# Patient Record
Sex: Male | Born: 1955 | Race: White | Hispanic: No | Marital: Married | Smoking: Never smoker
Health system: Southern US, Community
[De-identification: ages and names within clinical notes are randomized; demographics above are authoritative.]

## PROBLEM LIST (undated history)

## (undated) DIAGNOSIS — E785 Hyperlipidemia, unspecified: Secondary | ICD-10-CM

## (undated) DIAGNOSIS — F5104 Psychophysiologic insomnia: Secondary | ICD-10-CM

## (undated) DIAGNOSIS — M5430 Sciatica, unspecified side: Secondary | ICD-10-CM

## (undated) DIAGNOSIS — I10 Essential (primary) hypertension: Secondary | ICD-10-CM

## (undated) DIAGNOSIS — M1990 Unspecified osteoarthritis, unspecified site: Secondary | ICD-10-CM

## (undated) HISTORY — DX: Sciatica, unspecified side: M54.30

## (undated) HISTORY — PX: OTHER SURGICAL HISTORY: SHX169

## (undated) HISTORY — DX: Hyperlipidemia, unspecified: E78.5

## (undated) HISTORY — DX: Essential (primary) hypertension: I10

## (undated) HISTORY — PX: CHOLECYSTECTOMY: SHX55

## (undated) HISTORY — PX: HERNIA REPAIR: SHX51

---

## 2000-09-28 HISTORY — PX: OTHER SURGICAL HISTORY: SHX169

## 2005-10-20 ENCOUNTER — Ambulatory Visit: Payer: Self-pay | Admitting: Family Medicine

## 2005-11-13 ENCOUNTER — Ambulatory Visit: Payer: Self-pay | Admitting: Family Medicine

## 2005-11-13 LAB — CONVERTED CEMR LAB
Blood Glucose, Fasting: 95 mg/dL
TSH: 2.08 microintl units/mL
WBC, blood: 3.9 10*3/uL

## 2005-11-27 ENCOUNTER — Ambulatory Visit: Payer: Self-pay | Admitting: Family Medicine

## 2006-07-06 ENCOUNTER — Ambulatory Visit: Payer: Self-pay | Admitting: Family Medicine

## 2006-10-08 ENCOUNTER — Ambulatory Visit: Payer: Self-pay | Admitting: Family Medicine

## 2007-01-06 ENCOUNTER — Ambulatory Visit: Payer: Self-pay | Admitting: Family Medicine

## 2007-01-06 LAB — CONVERTED CEMR LAB
Albumin: 3.9 g/dL (ref 3.5–5.2)
Alkaline Phosphatase: 50 units/L (ref 39–117)
BUN: 14 mg/dL (ref 6–23)
GFR calc Af Amer: 91 mL/min
GFR calc non Af Amer: 75 mL/min
LDL Cholesterol: 127 mg/dL — ABNORMAL HIGH (ref 0–99)
PSA: 0.99 ng/mL (ref 0.10–4.00)
Potassium: 4.8 meq/L (ref 3.5–5.1)
TSH: 2.59 microintl units/mL
TSH: 2.59 microintl units/mL (ref 0.35–5.50)
Total CHOL/HDL Ratio: 4.1
Triglycerides: 69 mg/dL (ref 0–149)
VLDL: 14 mg/dL (ref 0–40)

## 2007-01-14 ENCOUNTER — Ambulatory Visit: Payer: Self-pay | Admitting: Family Medicine

## 2007-03-14 ENCOUNTER — Ambulatory Visit: Payer: Self-pay | Admitting: Gastroenterology

## 2007-03-25 ENCOUNTER — Encounter: Payer: Self-pay | Admitting: Family Medicine

## 2007-03-25 ENCOUNTER — Ambulatory Visit: Payer: Self-pay | Admitting: Gastroenterology

## 2007-03-25 HISTORY — PX: COLONOSCOPY: SHX174

## 2007-03-25 LAB — HM COLONOSCOPY

## 2007-10-30 ENCOUNTER — Ambulatory Visit: Payer: Self-pay | Admitting: General Surgery

## 2007-10-30 HISTORY — PX: OTHER SURGICAL HISTORY: SHX169

## 2007-11-11 ENCOUNTER — Encounter: Payer: Self-pay | Admitting: Family Medicine

## 2008-06-22 ENCOUNTER — Ambulatory Visit: Payer: Self-pay | Admitting: Family Medicine

## 2008-06-25 LAB — CONVERTED CEMR LAB
ALT: 34 units/L (ref 0–53)
Basophils Absolute: 0 10*3/uL (ref 0.0–0.1)
Bilirubin, Direct: 0.1 mg/dL (ref 0.0–0.3)
CO2: 30 meq/L (ref 19–32)
Calcium: 9.3 mg/dL (ref 8.4–10.5)
Chloride: 109 meq/L (ref 96–112)
Cholesterol: 230 mg/dL (ref 0–200)
Creatinine, Ser: 1.2 mg/dL (ref 0.4–1.5)
Direct LDL: 173.3 mg/dL
Eosinophils Absolute: 0.1 10*3/uL (ref 0.0–0.7)
GFR calc non Af Amer: 68 mL/min
HDL: 43.9 mg/dL (ref >39.0)
Lymphocytes Relative: 26 % (ref 12.0–46.0)
MCHC: 35.5 g/dL (ref 30.0–36.0)
MCV: 90.6 fL (ref 78.0–100.0)
Neutro Abs: 3.1 10*3/uL (ref 1.4–7.7)
Neutrophils Relative %: 60.4 % (ref 43.0–77.0)
PSA: 1.48 ng/mL (ref 0.10–4.00)
RDW: 12 % (ref 11.5–14.6)
Sodium: 142 meq/L (ref 135–145)
TSH: 3.15 microintl units/mL (ref 0.35–5.50)
Total Bilirubin: 0.8 mg/dL (ref 0.3–1.2)
VLDL: 25 mg/dL (ref 0–40)

## 2008-06-27 ENCOUNTER — Encounter: Payer: Self-pay | Admitting: Family Medicine

## 2008-06-27 ENCOUNTER — Ambulatory Visit: Payer: Self-pay | Admitting: Family Medicine

## 2008-06-27 DIAGNOSIS — I10 Essential (primary) hypertension: Secondary | ICD-10-CM | POA: Insufficient documentation

## 2008-08-03 ENCOUNTER — Ambulatory Visit: Payer: Self-pay | Admitting: Family Medicine

## 2008-08-06 LAB — CONVERTED CEMR LAB: ALT: 41 units/L (ref 0–53)

## 2008-09-10 ENCOUNTER — Ambulatory Visit: Payer: Self-pay | Admitting: Family Medicine

## 2008-09-10 LAB — CONVERTED CEMR LAB
Cholesterol: 118 mg/dL (ref 0–200)
HDL: 47.9 mg/dL (ref >39.0)
LDL Cholesterol: 60 mg/dL (ref 0–99)
Triglycerides: 50 mg/dL (ref 0–149)
VLDL: 10 mg/dL (ref 0–40)

## 2008-09-17 ENCOUNTER — Ambulatory Visit: Payer: Self-pay | Admitting: Family Medicine

## 2008-10-15 ENCOUNTER — Telehealth: Payer: Self-pay | Admitting: Family Medicine

## 2008-11-19 ENCOUNTER — Telehealth: Payer: Self-pay | Admitting: Family Medicine

## 2008-11-22 ENCOUNTER — Ambulatory Visit: Payer: Self-pay | Admitting: Family Medicine

## 2009-07-10 ENCOUNTER — Telehealth: Payer: Self-pay | Admitting: Family Medicine

## 2009-09-05 ENCOUNTER — Ambulatory Visit: Payer: Self-pay | Admitting: Family Medicine

## 2009-09-05 LAB — CONVERTED CEMR LAB
ALT: 41 units/L (ref 0–53)
Albumin: 4.4 g/dL (ref 3.5–5.2)
BUN: 16 mg/dL (ref 6–23)
Bilirubin, Direct: 0.1 mg/dL (ref 0.0–0.3)
CO2: 29 meq/L (ref 19–32)
Calcium: 9.5 mg/dL (ref 8.4–10.5)
Chloride: 102 meq/L (ref 96–112)
Cholesterol: 130 mg/dL (ref 0–200)
Creatinine, Ser: 1.2 mg/dL (ref 0.4–1.5)
Glucose, Bld: 102 mg/dL — ABNORMAL HIGH (ref 70–99)
LDL Cholesterol: 69 mg/dL (ref 0–99)
PSA: 0.95 ng/mL (ref 0.10–4.00)
TSH: 2.55 microintl units/mL (ref 0.35–5.50)
Total Protein: 7.3 g/dL (ref 6.0–8.3)
VLDL: 20 mg/dL (ref 0.0–40.0)

## 2009-09-11 ENCOUNTER — Ambulatory Visit: Payer: Self-pay | Admitting: Family Medicine

## 2009-10-07 ENCOUNTER — Telehealth: Payer: Self-pay | Admitting: Family Medicine

## 2010-05-01 ENCOUNTER — Encounter (INDEPENDENT_AMBULATORY_CARE_PROVIDER_SITE_OTHER): Payer: Self-pay | Admitting: *Deleted

## 2010-07-03 ENCOUNTER — Ambulatory Visit: Payer: Self-pay | Admitting: Family Medicine

## 2010-07-03 DIAGNOSIS — M5430 Sciatica, unspecified side: Secondary | ICD-10-CM

## 2010-09-11 ENCOUNTER — Ambulatory Visit: Payer: Self-pay | Admitting: Family Medicine

## 2010-09-11 DIAGNOSIS — Z87898 Personal history of other specified conditions: Secondary | ICD-10-CM

## 2010-09-11 LAB — CONVERTED CEMR LAB
ALT: 37 units/L (ref 0–53)
AST: 25 units/L (ref 0–37)
Albumin: 4.2 g/dL (ref 3.5–5.2)
Alkaline Phosphatase: 52 units/L (ref 39–117)
BUN: 15 mg/dL (ref 6–23)
Chloride: 107 meq/L (ref 96–112)
Cholesterol: 143 mg/dL (ref 0–200)
Glucose, Bld: 95 mg/dL (ref 70–99)
HDL: 45.7 mg/dL (ref >39.00)
LDL Cholesterol: 83 mg/dL (ref 0–99)
PSA: 1.34 ng/mL (ref 0.10–4.00)
Potassium: 4.6 meq/L (ref 3.5–5.1)
TSH: 3.37 microintl units/mL (ref 0.35–5.50)
Total Bilirubin: 0.6 mg/dL (ref 0.3–1.2)
Total CHOL/HDL Ratio: 3

## 2010-09-15 ENCOUNTER — Ambulatory Visit: Payer: Self-pay | Admitting: Family Medicine

## 2010-09-15 DIAGNOSIS — I10 Essential (primary) hypertension: Secondary | ICD-10-CM | POA: Insufficient documentation

## 2010-09-15 DIAGNOSIS — E785 Hyperlipidemia, unspecified: Secondary | ICD-10-CM

## 2010-09-23 ENCOUNTER — Telehealth: Payer: Self-pay | Admitting: Family Medicine

## 2010-09-25 ENCOUNTER — Telehealth: Payer: Self-pay | Admitting: Family Medicine

## 2010-09-30 ENCOUNTER — Encounter: Payer: Self-pay | Admitting: Family Medicine

## 2010-10-28 NOTE — Assessment & Plan Note (Signed)
Summary: BACK PAIN/CLE   Vital Signs:  Patient profile:   55 year old male Height:      69 inches Weight:      181.0 pounds BMI:     26.83 Temp:     98.0 degrees F oral Pulse rate:   76 / minute Pulse rhythm:   regular BP sitting:   110 / 72  (left arm) Cuff size:   large  Vitals Entered By: Benny Lennert CMA Duncan Dull) (July 03, 2010 4:03 PM)  History of Present Illness: Chief complaint back pain  55 year old male:  travelling o [place] and pain on the back posterior pain, when sitting for a while, down all the way to ankle.   describes classic right-sided acute sciatica.  Pain originating in the buttock and going down the posterior aspect of the leg 2 and around the ankle.  At this point, no significant back pain.  No numbness and tingling. No bowel or bladder incontinence. No acute injury other than long-standing flight. This is been ongoing for about 2-3 weeks. The patient has tried no interventions, no  stretching, no  range of motion, no heat, no  medications.   His son is a physical therapist in [place].    acute sciatica  Back Pain History:      The patient's back pain has been present for < 6 weeks.  The pain is located in the lower back region and does radiate below the knees.  He states that he has had a prior history of back pain.  The patient has not had any recent physical therapy for his back pain.    Critical Exclusionary Diagnosis Criteria (CEDC) for Back Pain:      The patient denies a history of previous trauma.  He has no prior history of spinal surgery.  There are no symptoms to suggest infection, cauda equina, or psychosocial factors for back pain.  Cancer risk factors include age >50 yrs with new back pain.    Allergies (verified): No Known Drug Allergies  Past History:  Past medical, surgical, family and social histories (including risk factors) reviewed, and no changes noted (except as noted below).  Past Surgical History: Reviewed history  from 06/27/2008 and no changes required. RIH 1990s Hyperthyroid surgery 2002 Laparoscopic Choleycystectomy (Dr Lemar Livings) 10/30/2007 COLONOSCOPY-- INTERNAL HEMORRHOIDS (DR. KAPLAN) :(03/25/2007)  Family History: Reviewed history from 09/11/2009 and no changes required. Father A 73 HTN BAD BACK Mother A 70 Hypothyr  Brother A 47 Htn  CV: + MGF DECEASED FROM MASSIVE MI HBP: + FATHER //+ BROTHER 2 SONS : + MGF// + SELF DM: + MGF GOUT ARTHRITIS: NEGATIVE PROSTATE CANCER: BREAST/OVARIAN./UTERINE CANCER: COLON CANCER: + M AUNT DEPRESSION: NEGATIVE ETOH/DRUG ABUSE: NEGATIVE OTHER NEGATIVE STROKE  Social History: Reviewed history from 06/27/2008 and no changes required. Occupation: Press photographer for [employer] Married   2 Children  Review of Systems       REVIEW OF SYSTEMS  GEN: No systemic complaints, no fevers, chills, sweats, or other acute illnesses MSK: Detailed in the HPI GI: tolerating PO intake without difficulty Neuro: No numbness, parasthesias, or tingling associated. Otherwise the pertinent positives of the ROS are noted above.    Physical Exam  General:  GEN: Well-developed,well-nourished,in no acute distress; alert,appropriate and cooperative throughout examination HEENT: Normocephalic and atraumatic without obvious abnormalities. No apparent alopecia or balding. Ears, externally no deformities PULM: Breathing comfortably in no respiratory distress EXT: No clubbing, cyanosis, or edema PSYCH: Normally interactive. Cooperative during  the interview. Pleasant. Friendly and conversant. Not anxious or depressed appearing. Normal, full affect.  Msk:  Normal Greater trochanteric bursae Full hip ROM Negative Faber Negative Reverse Faber Sciatic Notches: nt piriformis nt Sensation to Gross touch WNL Sensation to pinpricnk WNL DTR 2+ knee and ankle no clonus DP and PT pulses are normal B   Low Back Pain Physical Exam:    Motor Exam/Strength:         Left Ankle  Dorsiflexion (L5,L4):     normal       Left Great Toe Dorsiflexion (L5,L4):     normal       Left Heel Walk (L5,some L4):     normal       Left Single Squat & Rise-Quads (L4):   normal       Left Toe Walk-calf (S1):       normal       Right Ankle Dorsiflexion (L5,L4):     normal       Right Great Toe Dorsiflexion (L5,L4):       normal       Right Heel Walk (L5,some L4):     normal       Right Single Squat & Rise Quads (L4):   normal       Right Toe Walk-calf (S1):       normal    Sensory Exam/Pinprick:        Left Medial Foot (L4):   normal       Left Dorsal Foot (L5):   normal       Left Lateral Foot (S1):   normal       Right Medial Foot (L4):   normal       Right Dorsal Foot (L5):   normal       Right Lateral Foot (S1):   normal    Reflexes:        Left Knee Jerk (L4):     normal       Left Ankle Reflex (S1):   normal       Right Knee Jerk:     normal       Right Ankle Reflex (S1):   normal    Straight Leg Raise (SLR):       Left Straight Leg Raise (SLR):   positive at 40 degrees       Right Straight Leg Raise (SLR):   negative   Impression & Recommendations:  Problem # 1:  SCIATICA, ACUTE (ICD-724.3) Assessment New Exam and history consistent with sciatica Anatomy reviewed using [facility] text  Start ROM protocols, direct massage of piriformis, HIP ROM, core stabily, pelvic stability neurontin for neuropathic irritation  prefers minimalist approach with regards to medication  if not getting better in a couple of weeks, i would probable pulse with steroids, use some zanaflex as well  Complete Medication List: 1)  Lisinopril 5 Mg Tabs (Lisinopril) .Marland Kitchen.. 1  tablet daily by mouth daily 2)  Simvastatin 80 Mg Tabs (Simvastatin) .... Take 1 tablet by mouth at bedtime 3)  Gabapentin 300 Mg Caps (Gabapentin) .Marland Kitchen.. 1 by mouth three times a day Prescriptions: GABAPENTIN 300 MG CAPS (GABAPENTIN) 1 by mouth three times a day  #90 x 3   Entered and Authorized by:   Hannah Beat MD   Signed by:   Hannah Beat MD on 07/03/2010   Method used:   [facility] [phone][facility]retail[facility]  [address]       [place], [place]  [address]       Ph: [phone] or [phone]       Fax: [phone]   RxID:   [id number]   Current Allergies (reviewed today): No known allergies

## 2010-10-28 NOTE — Letter (Signed)
Summary: Nadara Eaton letter  [place] [facility]  [address] [place], [place] [address]   Phone: [phone]  Fax: [phone]       05/01/2010 MRN: [id number]  [facility] [address] [place], [place]  [address]  Dear Mr. DETRICK, [facility] [place], and [facility] announce the retirement of Arta Silence, M.D., from full-time practice at the [facility] office effective March 27, 2010 and his plans of returning part-time.  It is important to Dr. Hetty Ely and to our practice that you understand that [facility] has seven physicians in our office for your health care needs.  We will continue to offer the same exceptional care that you have today.    Dr. Hetty Ely has spoken to many of you about his plans for retirement and returning part-time in the fall.   We will continue to work with you through the transition to schedule appointments for you in the office and meet the high standards that [facility] is committed to.   Again, it is with great pleasure that we share the news that Dr. Hetty Ely will return to [facility] in October of 2011 with a reduced schedule.    If you have any questions, or would like to request an appointment with one of our physicians, please call us at [phone] and press the option for Scheduling an appointment.  We take pleasure in providing you with excellent patient care and look forward to seeing you at your next office visit.  Our [facility] Physicians are:  Tillman Abide, M.D. Laurita Quint, M.D. Roxy Manns, M.D. Kerby Nora, M.D. Hannah Beat, M.D. Ruthe Mannan, M.D. We proudly welcomed Raechel Ache, M.D. and Eustaquio Boyden, M.D. to the practice in July/August 2011.  Sincerely,  [place]

## 2010-10-28 NOTE — Progress Notes (Signed)
Summary: Simvastatin , Lisinopril  Phone Note Refill Request Message from:  Patient on October 07, 2009 3:35 PM  Refills Requested: Medication #1:  LISINOPRIL 5 MG  TABS 1/2  tablet daily by mouth DAILY   Dosage confirmed as above?Dosage Confirmed  Medication #2:  SIMVASTATIN 80 MG TABS take 1/2 tablet by mouth at bedtime. He needs rx to be for 1 tablet daily instead of 1/2 so that he can just half them and it would cost him less that way.   Initial call taken by: Melody Comas,  October 07, 2009 3:36 PM Caller: Patient Call For: Shaune Leeks MD Summary of Call: Patient says that he needs rx for his   Follow-up for Phone Call        Called patient back and explained to him that the prescriptions have to be written the way they are prescribed and if not that could be considered fraud. Patient stated that he is the one that changed his medications to 1/2 daily of the Simvastatin and Lisinopril. Patient stated that he wants to start back taking a whole pill of each and would like this changed on his med sheet and correct [facility] Patient aware that Dr. Hetty Ely is out of the office. Follow-up by: Sydell Axon LPN,  October 07, 2009 4:21 PM  Additional Follow-up for Phone Call Additional follow up Details #1::        Patient notified that rx's have been sent to the pharmacy per his request. Additional Follow-up by: Sydell Axon LPN,  October 09, 2009 3:31 PM    New/Updated Medications: LISINOPRIL 5 MG  TABS (LISINOPRIL) 1  tablet daily by mouth DAILY SIMVASTATIN 80 MG TABS (SIMVASTATIN) take 1 tablet by mouth at bedtime Prescriptions: SIMVASTATIN 80 MG TABS (SIMVASTATIN) take 1 tablet by mouth at bedtime  #30 x 12   Entered and Authorized by:   Shaune Leeks MD   Signed by:   Shaune Leeks MD on 10/09/2009   Method used:   [facility]        [facility] (retail)       [address]       [place], [place]   [address]       Ph: [phone] or [phone]       Fax: [phone]   RxID:   [id number] LISINOPRIL 5 MG  TABS (LISINOPRIL) 1  tablet daily by mouth DAILY  #30 x 12   Entered and Authorized by:   Shaune Leeks MD   Signed by:   Shaune Leeks MD on 10/09/2009   Method used:   [facility]        [facility] (retail)       [address]       [place], [place]  [address]       Ph: [phone] or [phone]       Fax: [phone]   RxID:   [id number]

## 2010-10-30 NOTE — Assessment & Plan Note (Signed)
Summary: CPX/XFER FROM DR SCHALLER/CLE   Vital Signs:  Patient profile:   55 year old male Height:      69 inches Weight:      193.75 pounds BMI:     28.72 Temp:     98 degrees F oral Pulse rate:   84 / minute Pulse rhythm:   regular BP sitting:   122 / 74  (left arm) Cuff size:   large  Vitals Entered By: Delilah Shan CMA (AAMA) (September 15, 2010 8:30 AM) CC: CPX - Transfer from [facility]   History of Present Illness: CPE- see plan  Back pain.  prev with sciatica and on neurontin.  Pain is similar to before. L leg posterior pain, radiating down L leg.  Better up and walking.  Worse sitting and laying down.  No weakness. Prev with relief with neurontin.    Hypertension:      Using medication without problems or lightheadedness: yes Chest pain with exertion:no Edema:no Short of breath:no Average home ZOX:WRUEAVW to today Other issues:no  Elevated Cholesterol: Using medications without problems:yes Muscle aches: no Other complaints: no  On half doses of medicine, updated list.   Current Medications (verified): 1)  Lisinopril 5 Mg  Tabs (Lisinopril) .Marland Kitchen.. 1  Tablet Daily By Mouth Daily 2)  Simvastatin 80 Mg Tabs (Simvastatin) .... Take 1 Tablet By Mouth At Bedtime 3)  Gabapentin 300 Mg Caps (Gabapentin) .Marland Kitchen.. 1 By Mouth Three Times A Day  Allergies: No Known Drug Allergies  Past History:  Past Medical History: Hyperlipidemia Hypertension h/o sciatica  Past Surgical History: RIH 1990s Hyperparathyroid surgery 2002 Laparoscopic Choleycystectomy (Dr Lemar Livings) 10/30/2007 COLONOSCOPY-- INTERNAL HEMORRHOIDS (DR. KAPLAN) :(03/25/2007)  Family History: Reviewed history from 09/11/2009 and no changes required. Father A HTN BAD BACK Mother A Hypothyr  Brother A Htn  2 sons with HTN, dx'd in their 77s CV: + MGF DECEASED FROM MASSIVE MI HBP: + FATHER //+ BROTHER 2 SONS : + MGF// + SELF DM: + MGF GOUT ARTHRITIS: NEGATIVE PROSTATE CANCER: BREAST/OVARIAN./UTERINE  CANCER: COLON CANCER: + M AUNT DEPRESSION: NEGATIVE ETOH/DRUG ABUSE: NEGATIVE OTHER NEGATIVE STROKE  Social History: Reviewed history from 06/27/2008 and no changes required. 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  2 Children From [place], in [place] 30+year. Enjoys Sports coach, yardwork Exercise- minimal, prev walking no tob alcohol: 6 pack/week  Review of Systems       See HPI.  Otherwise negative.    Physical Exam  General:  GEN: nad, alert and oriented HEENT: mucous membranes moist NECK: supple w/o LA CV: rrr.  no murmur PULM: ctab, no inc wob ABD: soft, +bs EXT: no edema SKIN: no acute rash  SLR positive  on L, neg on R, distally nv intact o/w with normal strength and dtrs for bilateral lower extremities Rectal:  No external abnormalities noted. Normal sphincter tone. No rectal masses or tenderness. Prostate:  Prostate gland firm and smooth, no enlargement, nodularity, tenderness, mass, asymmetry or induration.   Impression & Recommendations:  Problem # 1:  Preventive Health Care (ICD-V70.0) d/w patient UJ:WJXB and exericse, healthy habits.  labs d/w patient.   Problem # 2:  HYPERTENSION (ICD-401.9) No change in meds. controlled.  His updated medication list for this problem includes:    Lisinopril 5 Mg Tabs (Lisinopril) .Marland Kitchen... 1/2  tablet daily by mouth daily  Problem # 3:  HYPERLIPIDEMIA (ICD-272.4) no change in meds. controlled.  His updated medication list for this problem includes:  Simvastatin 80 Mg Tabs (Simvastatin) .Marland Kitchen... Take 1/2 tablet by mouth at bedtime  Problem # 4:  SCIATICA, ACUTE (ICD-724.3) d/w patient YQ:[id number] stretching and core work for strength.  continue neurontin and add on vicodin as needed.  no need to image based on exam.  follow up as needed.  he agrees.  His updated medication list for this problem includes:    Vicodin 5-500 Mg Tabs  (Hydrocodone-acetaminophen) .Marland Kitchen... 1/2-1 by mouth three times a day as needed for pain, sedation caution  Complete Medication List: 1)  Lisinopril 5 Mg Tabs (Lisinopril) .... 1/2  tablet daily by mouth daily 2)  Simvastatin 80 Mg Tabs (Simvastatin) .... Take 1/2 tablet by mouth at bedtime 3)  Gabapentin 300 Mg Caps (Gabapentin) .Marland Kitchen.. 1 by mouth three times a day 4)  Vicodin 5-500 Mg Tabs (Hydrocodone-acetaminophen) .... 1/2-1 by mouth three times a day as needed for pain, sedation caution  Patient Instructions: 1)  Work on the back stretches and use the vicodin as needed.  Keep taking the gabapentin.  Let me know if you aren't getting better so we can set you up with physical therapy.  We should recheck your labs in 1 year.  We can talk about checking a PSA at that point.  Glad to see you today.  Prescriptions: SIMVASTATIN 80 MG TABS (SIMVASTATIN) take 1/2 tablet by mouth at bedtime  #45 x 3   Entered and Authorized by:   Crawford Givens MD   Signed by:   Crawford Givens MD on 09/15/2010   Method used:   Print then Give to Patient   RxID:   [id number] LISINOPRIL 5 MG  TABS (LISINOPRIL) 1/2  tablet daily by mouth DAILY  #45 x 3   Entered and Authorized by:   Crawford Givens MD   Signed by:   Crawford Givens MD on 09/15/2010   Method used:   Print then Give to Patient   RxID:   [id number] VICODIN 5-500 MG TABS (HYDROCODONE-ACETAMINOPHEN) 1/2-1 by mouth three times a day as needed for pain, sedation caution  #40 x 0   Entered and Authorized by:   Crawford Givens MD   Signed by:   Crawford Givens MD on 09/15/2010   Method used:   Print then Give to Patient   RxID:   [id number]    Orders Added: 1)  Est. Patient 40-64 years [99396] 2)  Est. Patient Level III [23762]   Immunization History:  Influenza Immunization History:    Influenza:  historical (06/28/2010)   Immunization History:  Influenza Immunization History:    Influenza:  Historical (06/28/2010)  Current Allergies  (reviewed today): No known allergies

## 2010-10-30 NOTE — Progress Notes (Signed)
Summary: medicine not helping back pain.  Phone Note Call from Patient Call back at [phone]   Caller: Patient Summary of Call: Pt has been taking gabapentin for about 2 weeks for his back pain.  He is taking 1200 mg's a day.  He says this is not helping his pain at all and is asking if there is something else that he can  take, or should he be referred to a specialist.  [facility]  If he is referred he has no preference where, but he wants the best. Initial call taken by: Lowella Petties CMA, AAMA,  September 23, 2010 8:09 AM  Follow-up for Phone Call        I would refer to physical therapy for tx per our prev plan.  thanks.  Order is in EMR.  Follow-up by: Crawford Givens MD,  September 24, 2010 12:48 PM  Additional Follow-up for Phone Call Additional follow up Details #1::        Patient says  his son is a physical therapist and he has been working with him religiously for 2 weeks and nothing is helping.  He says the Gabapentin is doing nothing and the generic Vicodin is doing nothing.  He says if he could take med that would allow him to sleep, he would be okay but that what he has is useless.  Lugene Fuquay CMA Duncan Dull)  September 24, 2010 2:29 PM     Additional Follow-up for Phone Call Additional follow up Details #2::    refer to neurosurg.  Follow-up by: Crawford Givens MD,  September 24, 2010 2:56 PM

## 2010-10-30 NOTE — Consult Note (Signed)
Summary: [facility]  [facility]   Imported By: Maryln Gottron 10/09/2010 12:41:58  _____________________________________________________________________  External Attachment:    Type:   Image     Comment:   External Document  Appended Document: [facility]     Clinical Lists Changes  Observations: Added new observation of PAST MED HX: Hyperlipidemia Hypertension h/o sciatica- right L5 distribution- [facility] 2012 (10/09/2010 22:59)       Past History:  Past Medical History: Hyperlipidemia Hypertension h/o sciatica- right L5 distribution- [facility] 2012

## 2010-10-30 NOTE — Progress Notes (Signed)
  Phone Note Call from Patient Call back at [phone]   Caller: Patient Summary of Call: Patient has an appt with Dr Yevette Edwards on 09/30/2010. He would like you to call in the Percocet for [facility] to try at [facility]. He also needs to know should he continue taking the Gabapentin? He is taking 2 in the AM, 1 at lunc, and then 1 at night, please advise if he should continue this with the percocet? Please have Lugene call him at [phone] with your reply. Initial call taken by: Carlton Adam,  September 25, 2010 9:39 AM  Follow-up for Phone Call        I would keep taking the neurontin.  I'll print the percocet for the patient.  It's okay to take those two together.  Stop the vicodin.  Follow-up by: Crawford Givens MD,  September 25, 2010 9:52 AM  Additional Follow-up for Phone Call Additional follow up Details #1::        Patient Advised. Prescription left at front desk.  Additional Follow-up by: Delilah Shan CMA Robb Sibal Dull),  September 25, 2010 12:42 PM    New/Updated Medications: PERCOCET 5-325 MG TABS (OXYCODONE-ACETAMINOPHEN) 1-2 by mouth three times a day for pain with sedation caution Prescriptions: PERCOCET 5-325 MG TABS (OXYCODONE-ACETAMINOPHEN) 1-2 by mouth three times a day for pain with sedation caution  #40 x 0   Entered and Authorized by:   Crawford Givens MD   Signed by:   Crawford Givens MD on 09/25/2010   Method used:   Print then Give to Patient   RxID:   [id number]

## 2011-06-03 ENCOUNTER — Other Ambulatory Visit: Payer: Self-pay | Admitting: *Deleted

## 2011-06-03 MED ORDER — LISINOPRIL 2.5 MG PO TABS: 2.5000 mg | ORAL_TABLET | Freq: Every day | ORAL | Status: DC

## 2011-06-03 MED ORDER — SIMVASTATIN 40 MG PO TABS: 40.0000 mg | ORAL_TABLET | Freq: Every day | ORAL | Status: DC

## 2011-09-07 ENCOUNTER — Other Ambulatory Visit: Payer: Self-pay | Admitting: Family Medicine

## 2011-09-07 DIAGNOSIS — E7800 Pure hypercholesterolemia, unspecified: Secondary | ICD-10-CM

## 2011-09-14 ENCOUNTER — Other Ambulatory Visit: Payer: Self-pay | Admitting: Family Medicine

## 2011-09-14 ENCOUNTER — Other Ambulatory Visit (INDEPENDENT_AMBULATORY_CARE_PROVIDER_SITE_OTHER): Payer: BC Managed Care – PPO

## 2011-09-14 DIAGNOSIS — E7800 Pure hypercholesterolemia, unspecified: Secondary | ICD-10-CM

## 2011-09-14 DIAGNOSIS — Z125 Encounter for screening for malignant neoplasm of prostate: Secondary | ICD-10-CM

## 2011-09-14 LAB — COMPREHENSIVE METABOLIC PANEL
Albumin: 4.3 g/dL (ref 3.5–5.2)
Alkaline Phosphatase: 49 U/L (ref 39–117)
BUN: 13 mg/dL (ref 6–23)
Calcium: 9.5 mg/dL (ref 8.4–10.5)
GFR: 81.28 mL/min (ref >60.00)
Glucose, Bld: 100 mg/dL — ABNORMAL HIGH (ref 70–99)
Potassium: 4.7 mEq/L (ref 3.5–5.1)

## 2011-09-14 LAB — LIPID PANEL
Cholesterol: 120 mg/dL (ref 0–200)
VLDL: 9 mg/dL (ref 0.0–40.0)

## 2011-09-14 LAB — PSA: PSA: 1.08 ng/mL (ref 0.10–4.00)

## 2011-09-14 NOTE — Progress Notes (Signed)
Addended by: Liane Comber C on: 09/14/2011 11:00 AM   Modules accepted: Orders

## 2011-09-17 ENCOUNTER — Encounter: Payer: Self-pay | Admitting: Family Medicine

## 2011-09-18 ENCOUNTER — Ambulatory Visit (INDEPENDENT_AMBULATORY_CARE_PROVIDER_SITE_OTHER): Payer: BC Managed Care – PPO | Admitting: Family Medicine

## 2011-09-18 ENCOUNTER — Encounter: Payer: Self-pay | Admitting: Family Medicine

## 2011-09-18 VITALS — BP 122/72 | HR 52 | Temp 97.0°F | Ht 69.0 in | Wt 170.0 lb

## 2011-09-18 DIAGNOSIS — Z0000 Encounter for general adult medical examination without abnormal findings: Secondary | ICD-10-CM

## 2011-09-18 DIAGNOSIS — I10 Essential (primary) hypertension: Secondary | ICD-10-CM

## 2011-09-18 DIAGNOSIS — E7800 Pure hypercholesterolemia, unspecified: Secondary | ICD-10-CM

## 2011-09-18 DIAGNOSIS — E785 Hyperlipidemia, unspecified: Secondary | ICD-10-CM

## 2011-09-18 MED ORDER — SIMVASTATIN 40 MG PO TABS: 20.0000 mg | ORAL_TABLET | Freq: Every day | ORAL | Status: DC

## 2011-09-18 NOTE — Patient Instructions (Addendum)
I would stop the lisinopril and see if you have elevated pressures.  If you have pressures in the 120s/80s, then you don't need restart the medicine.  Cut the simvastatin in half and recheck fasting labs in about 2 months.   I would get a flu shot each fall.   Schedule a 30 min visit in 2 months.  Get fasting labs ahead of time. We'll biopsy the site on your L shoulder at the visit.

## 2011-09-18 NOTE — Progress Notes (Signed)
CPE- See plan.  Routine anticipatory guidance given to patient.  See health maintenance. COLONOSCOPY-- INTERNAL HEMORRHOIDS (DR. KAPLAN) :(03/25/2007), 10 year f/u.   He has occ flare of hemorrhoids and bleeding.  Noted blood on toilet paper recently.  He'll call back if needed.    He feels something in the L nostril, occ irritated.    PSA wnl.  D/w pt.  No FH.    Hypertension:    Using medication without problems or lightheadedness: yes Chest pain with exertion:no Edema:no Short of breath:no  Elevated Cholesterol: Using medications without problems:yes Muscle aches: no Diet compliance:yes Exercise:yes Losing weight with diet and exercise.   PMH and SH reviewed  Meds, vitals, and allergies reviewed.   ROS: See HPI.  Otherwise negative.    GEN: nad, alert and oriented HEENT: mucous membranes moist, L nostril with nonulcerated wart, small.  NECK: supple w/o LA CV: rrr. PULM: ctab, no inc wob ABD: soft, +bs EXT: no edema SKIN: no acute rash but 5x8mm maculopapular lesion on L shoulder noted, has mult pigmentations, no ulceration.   Prostate gland firm and smooth, no enlargement, nodularity, tenderness, mass, asymmetry or induration.

## 2011-09-20 ENCOUNTER — Encounter: Payer: Self-pay | Admitting: Family Medicine

## 2011-09-20 DIAGNOSIS — Z0000 Encounter for general adult medical examination without abnormal findings: Secondary | ICD-10-CM | POA: Insufficient documentation

## 2011-09-20 NOTE — Assessment & Plan Note (Signed)
Stop ACE, monitor BP, improved with weight loss.

## 2011-09-20 NOTE — Assessment & Plan Note (Signed)
Dec statin to 20mg  simva, return for lipids in 2013, improved with weight loss.

## 2011-09-20 NOTE — Assessment & Plan Note (Signed)
DRE wnl, colon up to date.  Flu and td okay.  Return for biopsy- punch of L shoulder lesion.  Reassured about wart in nose- we could refer for treatment if needed.  He declined.  Healthy habits encouraged.  He will be on less meds after today, mainly due to his weight mgmt.  I thanked him for his efforts.

## 2011-11-23 ENCOUNTER — Other Ambulatory Visit (INDEPENDENT_AMBULATORY_CARE_PROVIDER_SITE_OTHER): Payer: BC Managed Care – PPO

## 2011-11-23 DIAGNOSIS — E7800 Pure hypercholesterolemia, unspecified: Secondary | ICD-10-CM

## 2011-11-23 LAB — LIPID PANEL
LDL Cholesterol: 53 mg/dL (ref 0–99)
Total CHOL/HDL Ratio: 2
Triglycerides: 46 mg/dL (ref 0.0–149.0)

## 2011-11-27 ENCOUNTER — Encounter: Payer: Self-pay | Admitting: Family Medicine

## 2011-11-27 ENCOUNTER — Ambulatory Visit (INDEPENDENT_AMBULATORY_CARE_PROVIDER_SITE_OTHER): Payer: BC Managed Care – PPO | Admitting: Family Medicine

## 2011-11-27 VITALS — BP 116/80 | HR 76 | Temp 97.7°F | Wt 162.0 lb

## 2011-11-27 DIAGNOSIS — E785 Hyperlipidemia, unspecified: Secondary | ICD-10-CM

## 2011-11-27 DIAGNOSIS — L989 Disorder of the skin and subcutaneous tissue, unspecified: Secondary | ICD-10-CM

## 2011-11-27 DIAGNOSIS — I10 Essential (primary) hypertension: Secondary | ICD-10-CM

## 2011-11-27 DIAGNOSIS — E7800 Pure hypercholesterolemia, unspecified: Secondary | ICD-10-CM

## 2011-11-27 DIAGNOSIS — J3489 Other specified disorders of nose and nasal sinuses: Secondary | ICD-10-CM

## 2011-11-27 NOTE — Progress Notes (Signed)
BP is fine off meds, lipids are still low on 1/2 tab of 40mg  simvastatin.   Will stop simvastatin and recheck lipids in 2 months.  He's lost ~30lbs overall.   Nasal lesion, L nostril, is bothering him.  Prev saw ENT, would like referral back.    Nasal exam with small papule in L nostril.  L shoulder lesion.Punch biopsy  Meds, vitals, and allergies reviewed.   Indication: suspicious lesion  Location: L anterior shoulder  Size:5x47mm  Verbal informed consent obtained.  Pt aware of risks not limited to but including infection, bleeding, damage to near by organs.  Prep: etoh/betadine  Anesthesia: 1%lidocaine with epi, good effect  Punch made with __3__mm punch  Minimal oozing, controlled with silver nitrate  Tolerated well  Routine postprocedure instructions d/w pt- keep area clean and bandaged, follow up if concerns/spreading erythema/pain.

## 2011-11-27 NOTE — Patient Instructions (Addendum)
You don't need a visit in 2 months with me, but come get labs done (fasting).  See Shirlee Limerick about your referral before you leave today. Keep the area clean and covered with neosporin.   Take care.  We'll contact you with your lab report.

## 2011-11-29 NOTE — Assessment & Plan Note (Signed)
Improved with weight loss.  Recheck in 2 months.  No meds now.

## 2011-11-29 NOTE — Assessment & Plan Note (Signed)
Tolerated well.  Routine postprocedure instructions d/w pt- keep area clean and bandaged, follow up if concerns/spreading erythema/pain.  Await path.

## 2011-11-29 NOTE — Assessment & Plan Note (Signed)
Resolved

## 2011-12-03 ENCOUNTER — Telehealth: Payer: Self-pay | Admitting: Family Medicine

## 2011-12-03 NOTE — Telephone Encounter (Signed)
Monday is reasonable if this was an isolated event.  Will see pt then.

## 2011-12-03 NOTE — Telephone Encounter (Signed)
Triage Record Num: [id number] Operator: Baldomero Lamy Patient Name: Shawn Ellison Call Date & Time: 12/03/2011 12:47:07PM Patient Phone: [phone] PCP: Crawford Givens Patient Gender: Male PCP Fax : Patient DOB: 11-Mar-1956 Practice Name: [facility] Reason for Call: Caller: Kelsey/Patient; PCP: Crawford Givens Clelia Croft); CB#: [phone]; ; ; Call regarding Patient Did Some Extreme Exercising and Had Blood in His Urine Recently; Pt calling regarding blood in urine after an extreme work out on 3/4. Pt only had the one instance that day and denies any other sxs or pain since. Afebrile. No tx. Pt states he had only had 1 cup of coffee that day before working out. Emergent sxs of Bloody Urine r/o. Disp: 72 hrs. Pt refused 72 hrs stating he needed Monday appt. Schedulre able to make 0815 appt for 3/11. Relayed to pt with home care advice and strict call back parameters. Protocol(s) Used: Bloody Urine Recommended Outcome per Protocol: See Provider within 72 Hours Reason for Outcome: Follows prolonged and intense exercise AND not previously evaluated Care Advice: ~ Avoid vigorous activity until evaluated by provider. Malignancy can cause hematuria. All hematuria must be evaluated by provider and sometimes repeated testing is necessary to find the cause. ~ Two hours before exercise, drink 2 eight-ounce (.5 liter) glasses of fluid (water or sports drink). Drink another 8 ounce (.25 liter) of fluid just before the exercise and continue to drink fluids during the exercise. ~ ~ HEALTH PROMOTION / MAINTENANCE Call provider immediately if there is no urine output despite drinking adequate fluids or if muscles are swollen, especially in one extremity. ~ ~ SYMPTOM / CONDITION MANAGEMENT ~ CAUTIONS Most adults need to drink 6-10 eight-ounce glasses (1.2-2.0 liters) of fluids per day unless previously told to limit fluid intake for other medical reasons. Limit fluids that contain caffeine,  sugar or alcohol. Urine will be a very light yellow color when you drink enough fluids. ~ 12/03/2011 1:05:16PM Page 1 of 1 CAN_TriageRpt_V2

## 2011-12-03 NOTE — Telephone Encounter (Signed)
Triage Record Num: [id number] Operator: Kimberly Barnwell Patient Name: Shawn Ellison Call Date & Time: 12/03/2011 12:47:07PM Patient Phone: [phone] PCP: Graham Duncan Patient Gender: Male PCP Fax : Patient DOB: 04/04/1956 Practice Name: [facility] Reason for Call: Caller: Kenzo/Patient; PCP: Duncan, Graham (Shaw); CB#: [phone]; ; ; Call regarding Patient Did Some Extreme Exercising and Had Blood in His Urine Recently; Pt calling regarding blood in urine after an extreme work out on 3/4. Pt only had the one instance that day and denies any other sxs or pain since. Afebrile. No tx. Pt states he had only had 1 cup of coffee that day before working out. Emergent sxs of Bloody Urine r/o. Disp: 72 hrs. Pt refused 72 hrs stating he needed Monday appt. Schedulre able to make 0815 appt for 3/11. Relayed to pt with home care advice and strict call back parameters. Protocol(s) Used: Bloody Urine Recommended Outcome per Protocol: See Provider within 72 Hours Reason for Outcome: Follows prolonged and intense exercise AND not previously evaluated Care Advice: ~ Avoid vigorous activity until evaluated by provider. Malignancy can cause hematuria. All hematuria must be evaluated by provider and sometimes repeated testing is necessary to find the cause. ~ Two hours before exercise, drink 2 eight-ounce (.5 liter) glasses of fluid (water or sports drink). Drink another 8 ounce (.25 liter) of fluid just before the exercise and continue to drink fluids during the exercise. ~ ~ HEALTH PROMOTION / MAINTENANCE Call provider immediately if there is no urine output despite drinking adequate fluids or if muscles are swollen, especially in one extremity. ~ ~ SYMPTOM / CONDITION MANAGEMENT ~ CAUTIONS Most adults need to drink 6-10 eight-ounce glasses (1.2-2.0 liters) of fluids per day unless previously told to limit fluid intake for other medical reasons. Limit fluids that contain caffeine,  sugar or alcohol. Urine will be a very light yellow color when you drink enough fluids. ~ 12/03/2011 1:05:16PM Page 1 of 1 CAN_TriageRpt_V2 

## 2011-12-03 NOTE — Telephone Encounter (Signed)
Monday is reasonable if this was an isolated event.

## 2011-12-07 ENCOUNTER — Ambulatory Visit (INDEPENDENT_AMBULATORY_CARE_PROVIDER_SITE_OTHER): Payer: BC Managed Care – PPO | Admitting: Family Medicine

## 2011-12-07 ENCOUNTER — Encounter: Payer: Self-pay | Admitting: Family Medicine

## 2011-12-07 DIAGNOSIS — R319 Hematuria, unspecified: Secondary | ICD-10-CM

## 2011-12-07 DIAGNOSIS — R300 Dysuria: Secondary | ICD-10-CM

## 2011-12-07 LAB — POCT URINALYSIS DIPSTICK
Blood, UA: NEGATIVE
Glucose, UA: NEGATIVE
Nitrite, UA: NEGATIVE
Urobilinogen, UA: NEGATIVE

## 2011-12-07 NOTE — Patient Instructions (Addendum)
If you have another episode, bring [place] a sample.  Otherwise, nothing else needs to be done.  Take care.

## 2011-12-07 NOTE — Assessment & Plan Note (Signed)
This may not have even been blood.  It could be dark urine due to dehydration.  It could have been runner's hematuria.  He is low risk for sig pathology.  This was discussed.  Will send home with urine container, collect if sx return.  O/w, no other w/u.  He agrees.  F/u prn.

## 2011-12-07 NOTE — Progress Notes (Signed)
Ran a 10K+ 1 week ago in about 1 hour on a treadmill.  Was feeling fine at that point but had dark brown urine x1.  No bright red urine.  No pain urination.  No fevers, no new back pain (he has some baseline).  No other episodes of similar.  He was likely relatively dehydrated at the time.  No h/o stones. He doesn't smoke.  PSA was recently normal.  He didn't feel bad when the episode occurred.   Meds, vitals, and allergies reviewed.   ROS: See HPI.  Otherwise, noncontributory.  nad Testes bilaterally descended without nodularity, tenderness or masses. No scrotal masses or lesions. No penis lesions or urethral discharge.  U/a was neg in office today.

## 2011-12-11 ENCOUNTER — Other Ambulatory Visit: Payer: Self-pay | Admitting: Family Medicine

## 2012-02-01 ENCOUNTER — Other Ambulatory Visit: Payer: BC Managed Care – PPO

## 2012-02-03 ENCOUNTER — Other Ambulatory Visit (INDEPENDENT_AMBULATORY_CARE_PROVIDER_SITE_OTHER): Payer: BC Managed Care – PPO

## 2012-02-03 ENCOUNTER — Other Ambulatory Visit: Payer: Self-pay | Admitting: Family Medicine

## 2012-02-03 DIAGNOSIS — E7800 Pure hypercholesterolemia, unspecified: Secondary | ICD-10-CM

## 2012-02-03 LAB — LIPID PANEL
Total CHOL/HDL Ratio: 3
Triglycerides: 79 mg/dL (ref 0.0–149.0)

## 2012-02-04 ENCOUNTER — Encounter: Payer: Self-pay | Admitting: *Deleted

## 2012-03-03 ENCOUNTER — Telehealth: Payer: Self-pay

## 2012-03-03 MED ORDER — TYPHOID VACCINE PO CPDR: 1.0000 | DELAYED_RELEASE_CAPSULE | ORAL | Status: AC

## 2012-03-03 NOTE — Telephone Encounter (Signed)
Attempted to call patient and verify pharmacy. Reached voicemail and advised [facility] was [facility] since that was pharmacy on file. Advised to call back with any questions.

## 2012-03-03 NOTE — Telephone Encounter (Signed)
Pt requesting rx for oral typhoid prophylaxis (VIVOTIF TY21A) 4 tablets one per day on days 1,3,5 and 7. Immunity is good for 5 years. Pt traveling to [place] regularly. Pt had injectable typhoid immunization on 02/14/10.Please advise.

## 2012-03-03 NOTE — Telephone Encounter (Signed)
Injection is good for 2 years.  Would proceed with oral vaccine.  Please verify pharmacy and send.  Thanks.

## 2012-03-09 ENCOUNTER — Encounter: Payer: Self-pay | Admitting: Family Medicine

## 2012-03-09 ENCOUNTER — Telehealth: Payer: Self-pay

## 2012-03-09 ENCOUNTER — Ambulatory Visit (INDEPENDENT_AMBULATORY_CARE_PROVIDER_SITE_OTHER): Payer: BC Managed Care – PPO | Admitting: Family Medicine

## 2012-03-09 VITALS — BP 138/100 | HR 60 | Temp 97.8°F

## 2012-03-09 DIAGNOSIS — IMO0002 Reserved for concepts with insufficient information to code with codable children: Secondary | ICD-10-CM

## 2012-03-09 DIAGNOSIS — T148XXA Other injury of unspecified body region, initial encounter: Secondary | ICD-10-CM

## 2012-03-09 NOTE — Patient Instructions (Addendum)
Keep the area clean.  Avoid squatting.  Plan on coming back on 03/18/12 for suture removal.  Call back sooner if redness, fever, or discharge.  Take care.

## 2012-03-09 NOTE — Telephone Encounter (Signed)
Pt walked in; 15 minutes ago laceration to lt knee with hedge trimmers (hedge trimmer new; no rust) Pt has NKA; cleaned knee with betadine and clean dressing. Dr Para March will see pt. Now.

## 2012-03-10 ENCOUNTER — Encounter: Payer: Self-pay | Admitting: Family Medicine

## 2012-03-10 DIAGNOSIS — IMO0002 Reserved for concepts with insufficient information to code with codable children: Secondary | ICD-10-CM | POA: Insufficient documentation

## 2012-03-10 NOTE — Assessment & Plan Note (Signed)
Follow up in 9 days for suture removal, since it is on the knee.  F/u sooner prn.  Tolerated well. No complications.  Good tissue approximation.

## 2012-03-10 NOTE — Progress Notes (Signed)
Was trimming bushes today and cut R anterior knee with clippers.  Tetanus up to date.  No other complaints.   Suture  Meds, vitals, and allergies reviewed.   Indication: bleeding  Location: L knee  Size: 2cm, area cleaned with betadine and etoh  Anesthesia: 1%lidocaine with epi, good effect  Wound explored, irrigated, No FB to be removed  Type of suture material: 4.0 vicryl  Number of sutures:4  Tolerated well  Routine postprocedure instructions d/w pt- keep area clean and bandaged, follow up if concerns/spreading erythema/pain.

## 2012-03-18 ENCOUNTER — Encounter: Payer: Self-pay | Admitting: Family Medicine

## 2012-03-18 ENCOUNTER — Ambulatory Visit (INDEPENDENT_AMBULATORY_CARE_PROVIDER_SITE_OTHER): Payer: BC Managed Care – PPO | Admitting: Family Medicine

## 2012-03-18 VITALS — BP 114/70 | HR 63 | Temp 98.1°F | Wt 173.8 lb

## 2012-03-18 DIAGNOSIS — T148XXA Other injury of unspecified body region, initial encounter: Secondary | ICD-10-CM

## 2012-03-18 DIAGNOSIS — IMO0002 Reserved for concepts with insufficient information to code with codable children: Secondary | ICD-10-CM

## 2012-03-18 NOTE — Patient Instructions (Addendum)
Take care.  Trim the edge of the steri strip as needed.  Glad to see you.  No charge.

## 2012-03-18 NOTE — Progress Notes (Signed)
See plan

## 2012-03-18 NOTE — Assessment & Plan Note (Signed)
Sutures in for 9 days.  Exam w/o erythema and good tissue approximation.  4 sutures removed easily.  Very good tissue approximation remains except for minimal (<<1 cm) superficial area inferiorly (and this is greatly improved from pre-suture exam).  Covered with steri strip and should do well.  F/u prn. D/w pt.

## 2012-03-25 ENCOUNTER — Telehealth: Payer: Self-pay

## 2012-03-25 MED ORDER — SCOPOLAMINE 1 MG/3DAYS TD PT72: 1.0000 | MEDICATED_PATCH | TRANSDERMAL | Status: DC

## 2012-03-25 NOTE — Telephone Encounter (Signed)
Sent!

## 2012-03-25 NOTE — Telephone Encounter (Signed)
LMOVM of patient's contact phone.

## 2012-03-25 NOTE — Telephone Encounter (Signed)
Pt going deep sea fishing on and off for 2 weeks. Pt request # 4 of scopolamine patches to [facility]. Please advise.

## 2012-09-17 ENCOUNTER — Other Ambulatory Visit: Payer: Self-pay | Admitting: Family Medicine

## 2012-09-17 DIAGNOSIS — Z125 Encounter for screening for malignant neoplasm of prostate: Secondary | ICD-10-CM

## 2012-09-17 DIAGNOSIS — E7800 Pure hypercholesterolemia, unspecified: Secondary | ICD-10-CM

## 2012-09-19 ENCOUNTER — Other Ambulatory Visit (INDEPENDENT_AMBULATORY_CARE_PROVIDER_SITE_OTHER): Payer: BC Managed Care – PPO

## 2012-09-19 DIAGNOSIS — E7800 Pure hypercholesterolemia, unspecified: Secondary | ICD-10-CM

## 2012-09-19 DIAGNOSIS — Z125 Encounter for screening for malignant neoplasm of prostate: Secondary | ICD-10-CM

## 2012-09-19 LAB — LIPID PANEL
Cholesterol: 231 mg/dL — ABNORMAL HIGH (ref 0–200)
HDL: 47 mg/dL (ref >39.00)
Total CHOL/HDL Ratio: 5
Triglycerides: 156 mg/dL — ABNORMAL HIGH (ref 0.0–149.0)
VLDL: 31.2 mg/dL (ref 0.0–40.0)

## 2012-09-19 LAB — COMPREHENSIVE METABOLIC PANEL
ALT: 33 U/L (ref 0–53)
Alkaline Phosphatase: 48 U/L (ref 39–117)
Creatinine, Ser: 1 mg/dL (ref 0.4–1.5)
GFR: 82.88 mL/min (ref >60.00)
Sodium: 137 mEq/L (ref 135–145)
Total Bilirubin: 0.7 mg/dL (ref 0.3–1.2)
Total Protein: 6.3 g/dL (ref 6.0–8.3)

## 2012-09-27 ENCOUNTER — Encounter: Payer: Self-pay | Admitting: Family Medicine

## 2012-09-27 ENCOUNTER — Ambulatory Visit (INDEPENDENT_AMBULATORY_CARE_PROVIDER_SITE_OTHER): Payer: BC Managed Care – PPO | Admitting: Family Medicine

## 2012-09-27 VITALS — BP 128/80 | HR 64 | Temp 98.1°F | Ht 67.75 in | Wt 192.0 lb

## 2012-09-27 DIAGNOSIS — E7800 Pure hypercholesterolemia, unspecified: Secondary | ICD-10-CM

## 2012-09-27 DIAGNOSIS — R739 Hyperglycemia, unspecified: Secondary | ICD-10-CM

## 2012-09-27 DIAGNOSIS — E785 Hyperlipidemia, unspecified: Secondary | ICD-10-CM

## 2012-09-27 DIAGNOSIS — Z0000 Encounter for general adult medical examination without abnormal findings: Secondary | ICD-10-CM

## 2012-09-27 DIAGNOSIS — I10 Essential (primary) hypertension: Secondary | ICD-10-CM

## 2012-09-27 DIAGNOSIS — L989 Disorder of the skin and subcutaneous tissue, unspecified: Secondary | ICD-10-CM

## 2012-09-27 DIAGNOSIS — M5430 Sciatica, unspecified side: Secondary | ICD-10-CM

## 2012-09-27 NOTE — Progress Notes (Signed)
CPE- See plan.  Routine anticipatory guidance given to patient.  See health maintenance. Tetanus 2009 Flu shot done at work previously.   Colonoscopy 2008 PSA wnl.  Diet and exercise d/w pt.   Living will- has one- discussed.  Wife designated.    Hypertension:    Using medication without problems or lightheadedness: yes Chest pain with exertion:no Edema:no Short of breath:no Average home BPs:no He does have a dry cough that was attributed to a prev URI.   Less exercise due to pain from sciatica.  He is going to work on his diet also.   He's up 30lbs this year.   He's had no more hematuria.    He has seen ortho about his right elbow.    PMH and SH reviewed  Meds, vitals, and allergies reviewed.   ROS: See HPI.  Otherwise negative.    GEN: nad, alert and oriented HEENT: mucous membranes moist NECK: supple w/o LA CV: rrr. PULM: ctab, no inc wob ABD: soft, +bs EXT: no edema SKIN: no acute rash, small skin tag noted on R axilla.

## 2012-09-27 NOTE — Patient Instructions (Addendum)
Try to work on your weight.  Recheck fasting labs in summer 2014.  If the cough continues, then stop the lisinopril.  If the cough improves at that point, notify the clinic.  Take care.

## 2012-09-28 DIAGNOSIS — I10 Essential (primary) hypertension: Secondary | ICD-10-CM | POA: Insufficient documentation

## 2012-09-28 NOTE — Assessment & Plan Note (Signed)
He could had ACE vs postinfectious cough.  If cough not improved soon, then he'll stop ACE to see if cough resolves.  If so, he'll notify the clinic.  O/w continue ACE.  He agrees.  Needs to work on diet and weight.

## 2012-09-28 NOTE — Assessment & Plan Note (Signed)
Routine anticipatory guidance given to patient.  See health maintenance. Tetanus 2009 Flu shot done at work previously.   Colonoscopy 2008 PSA wnl.  Diet and exercise d/w pt.   Living will- has one- discussed.  Wife designated.

## 2012-09-28 NOTE — Assessment & Plan Note (Signed)
He's trying to work through this to allow for more exercise and better weight control.

## 2012-09-28 NOTE — Assessment & Plan Note (Signed)
Skin tag on axilla.  He can make a tourniquet at home with dental floss and it should fall off.  D/w pt.  He understood.

## 2012-09-28 NOTE — Assessment & Plan Note (Signed)
He'll work on diet and exercise.  Labs d/w pt. Recheck in about 6 months, along with sugar.

## 2012-10-13 ENCOUNTER — Telehealth: Payer: Self-pay

## 2012-10-13 NOTE — Telephone Encounter (Signed)
Pt left v/m; pt seen 09/27/12 and lisinopril stopped due to cough. Pt was off med for 2 weeks and cough went away. Pt restarted Lisinopril and cough came back. Pt request generic BP med sent to [facility].Please advise.

## 2012-10-14 MED ORDER — HYDROCHLOROTHIAZIDE 12.5 MG PO TABS: 12.5000 mg | ORAL_TABLET | Freq: Every day | ORAL | Status: DC

## 2012-10-14 NOTE — Telephone Encounter (Signed)
Patient notified as instructed by telephone. Pt has stopped Lisinopril and will start HCTZ; pt will call with any concerns or problems.

## 2012-10-14 NOTE — Telephone Encounter (Signed)
Noted. Med list updated.  Would change to HCTZ 12.5mg  a day.  rx sent. Thanks.

## 2012-11-14 ENCOUNTER — Telehealth: Payer: Self-pay | Admitting: *Deleted

## 2012-11-14 MED ORDER — AMLODIPINE BESYLATE 5 MG PO TABS: 5.0000 mg | ORAL_TABLET | Freq: Every day | ORAL | Status: DC

## 2012-11-14 MED ORDER — LISINOPRIL 2.5 MG PO TABS: 2.5000 mg | ORAL_TABLET | Freq: Every day | ORAL | Status: DC

## 2012-11-14 NOTE — Telephone Encounter (Signed)
When I tried to reach the patient, I had to leave a detailed VM.  He returned the call to my VM stating that he had read about Amlodipine and didn't think that this was the medication that he needed.  He says it seems that there are a lot of people going off of this medication.  The patient says he thinks the ARB's are a better choice and specifically mentioned Losartan.  He says this appears not to have the side effects that he is trying to avoid.

## 2012-11-14 NOTE — Telephone Encounter (Signed)
Since he can still get a return of the cough on the ARB, I'd try amlodipine first.

## 2012-11-14 NOTE — Telephone Encounter (Signed)
Patient refuses to take Amlodipine and says he will just take his chances with the cough rather than ED.

## 2012-11-14 NOTE — Addendum Note (Signed)
Addended by: Joaquim Nam on: 11/14/2012 01:49 PM   Modules accepted: Orders, Medications

## 2012-11-14 NOTE — Telephone Encounter (Signed)
LMOVM in detail.  

## 2012-11-14 NOTE — Telephone Encounter (Signed)
Pt left v/m requesting call back; problem with HCTZ. 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 Pt wants another BP med instead of HCTZ that will not cause impotency. [facility].Pt request call back when med called in.

## 2012-11-14 NOTE — Telephone Encounter (Signed)
Change to amlodipine.  Any BP med can cause ED.  rx sent.

## 2012-11-14 NOTE — Telephone Encounter (Signed)
Ok

## 2012-11-16 NOTE — Telephone Encounter (Signed)
Pt left v/m; pt has been out of BP for 2 days; is presently out of town but will return home 11-17-12 in the evening and request Cozaar to be called [facility]. Pt said he understands Dr Lianne Bushy concern about the coughing side effects but pt is more concerned about other side effects (ED).Please advise.

## 2012-11-16 NOTE — Telephone Encounter (Signed)
I thoughtfully sent the rx for the amlodipine prev.  Given that he already had an intolerance to the ACE, my opinion hasn't changed.  I would pick up the rx for the amlodipine and use that.  Thanks.

## 2012-11-16 NOTE — Addendum Note (Signed)
Addended by: Joaquim Nam on: 11/16/2012 02:07 PM   Modules accepted: Orders, Medications

## 2012-11-17 ENCOUNTER — Telehealth: Payer: Self-pay | Admitting: Family Medicine

## 2012-11-17 NOTE — Telephone Encounter (Signed)
LMOVM in detail.  

## 2012-11-17 NOTE — Telephone Encounter (Signed)
Pt called and LMOVM.  He disagrees about using amlodipine.  I listened to all of the message that was recorded.  His message ran longer than the recording limit. I called him back and LMOVM at home and cell.   To address his concerns: 1. He had and ACE cough and it was appropriately stopped.  2. He was started on HCTZ, which was a reasonable option.  It is a BP medicine, not just "a fluid pill" as he described in the phone call.  3. He had ED on the HCTZ.  This is possible on any BP medicine.  4. Given the ACE cough and the ED on HCTZ, it would be reasonable to change to a calcium channel blocker (ie amlodipine) as this would likely be the best tolerated medicine.   5. I am unable to guarantee the absence of side effects on any medicine, ie he could have lower extremity edema on amlodipine but this is unlikely at a low dose.  6. I am obligated to give the patient my best advice, and that would be to try the amlodipine.  7. Should he have an intolerance to amlodipine, we would need to consider other options.  8. He is always welcome to get a second opinion.

## 2012-11-18 ENCOUNTER — Telehealth: Payer: Self-pay | Admitting: Family Medicine

## 2012-11-18 NOTE — Telephone Encounter (Signed)
I called pt's work and cell phone.  No answer. LMOVM on his cell.  The amlodipine was given as my best medical opinion. At this point, he can either use that or seek a second opinion. I will defer to the patient at this point.

## 2012-12-12 ENCOUNTER — Encounter: Payer: Self-pay | Admitting: Family Medicine

## 2012-12-12 ENCOUNTER — Ambulatory Visit (INDEPENDENT_AMBULATORY_CARE_PROVIDER_SITE_OTHER): Payer: BC Managed Care – PPO | Admitting: Family Medicine

## 2012-12-12 ENCOUNTER — Telehealth: Payer: Self-pay | Admitting: Family Medicine

## 2012-12-12 VITALS — BP 114/84 | HR 68 | Temp 97.7°F

## 2012-12-12 DIAGNOSIS — R05 Cough: Secondary | ICD-10-CM

## 2012-12-12 DIAGNOSIS — I10 Essential (primary) hypertension: Secondary | ICD-10-CM

## 2012-12-12 MED ORDER — AZITHROMYCIN 250 MG PO TABS: ORAL_TABLET | ORAL | Status: DC

## 2012-12-12 NOTE — Telephone Encounter (Signed)
Patient Information:  Caller Name: Bowyn  Phone: [phone]  Patient: Shawn Ellison, Shawn Ellison  Gender: Male  DOB: July 13, 1956  Age: 57 Years  PCP: Crawford Givens Clelia Croft) [facility])  Office Follow Up:  Does the office need to follow up with this patient?: No  Instructions For The Office: N/A   Symptoms  Reason For Call & Symptoms: Pt calling today 12/12/12 regarding having cough x3 months.  Coughing up yellow mucus.  Reviewed Health History In EMR: Yes  Reviewed Medications In EMR: Yes  Reviewed Allergies In EMR: Yes  Reviewed Surgeries / Procedures: Yes  Date of Onset of Symptoms: 09/13/2012  Treatments Tried: OTC Muccinex  Treatments Tried Worked: No  Guideline(s) Used:  Cough  Disposition Per Guideline:   See Today or Tomorrow in Office  Reason For Disposition Reached:   Patient wants to be seen  Advice Given:  N/A  Patient Will Follow Care Advice:  YES  Appointment Scheduled:  12/12/2012 08:30:00 Appointment Scheduled Provider:  Crawford Givens Clelia Croft) [facility])

## 2012-12-12 NOTE — Patient Instructions (Signed)
Start the antibiotics today and let me know if you aren't improved next week.  Take care.  Glad to see you.

## 2012-12-12 NOTE — Assessment & Plan Note (Signed)
D/w pt about options. Would be reasonable to proceed with zmax to cover for walking PNA.  Nontoxic. If not improved, we'll need to consider other causes- ddx d/w pt, ie gerd, post nasal gtt, etc. He agrees. Call back as needed.

## 2012-12-12 NOTE — Assessment & Plan Note (Signed)
Resolved off meds.  Prev decisions d/w pt.  He agreed with decision.  With BP wnl, wouldn't intervene o/w now.

## 2012-12-12 NOTE — Progress Notes (Signed)
Off BP meds.  BP controlled today.  We talked about this.  He was okay about the decision when we talked about it.    Dry cough prev attributed to ACE improved off medicine.  Then had an apparent second type of cough.  Started at least 1.5 months ago.  Has been travelling, but most in [place].  In [place] in 1/14.  No fevers.  Sputum production started in the last week, greenish.  Wife has a cold and he didn't know (likley per patient) if he caught that on top of the ongoing cough.  Had a flu shot in the fall.  No wheeze.    For the cough that started about 1.5 months ago, no heartburn, dysphagia or change in taste.  No wheeze, no smoking.  No postnasal gtt until the last week.  He had some sick contacts in [place] during travel.  He has some sinus pressure but the cough is more troubling than the other sx.    Meds, vitals, and allergies reviewed.   ROS: See HPI.  Otherwise, noncontributory.  nad ncat Tm wnl Mild nasal irritation OP wnl Neck supple Dry cough noted ctab o/w.  No wheeze Ext well perfused.  Well appearing Sinuses not ttp x4

## 2012-12-14 ENCOUNTER — Other Ambulatory Visit: Payer: Self-pay | Admitting: Family Medicine

## 2013-03-13 ENCOUNTER — Other Ambulatory Visit: Payer: BC Managed Care – PPO

## 2017-01-12 ENCOUNTER — Encounter: Payer: Self-pay | Admitting: Gastroenterology

## 2018-05-11 ENCOUNTER — Ambulatory Visit: Payer: BLUE CROSS/BLUE SHIELD | Admitting: Urology

## 2018-05-11 ENCOUNTER — Telehealth: Payer: Self-pay | Admitting: Urology

## 2018-05-11 ENCOUNTER — Encounter: Payer: Self-pay | Admitting: Urology

## 2018-05-11 VITALS — BP 155/102 | HR 67 | Ht 69.0 in | Wt 192.0 lb

## 2018-05-11 DIAGNOSIS — R972 Elevated prostate specific antigen [PSA]: Secondary | ICD-10-CM

## 2018-05-11 NOTE — Progress Notes (Signed)
05/11/2018 3:48 PM   Shawn Ellison 1956/05/20 [id number]  Referring provider: Tonia Ghent, MD [address] [place], [place] [address]  Chief Complaint  Patient presents with  . Elevated PSA    New Patient    HPI: 62 year old male seen in consultation at the request of Dr. Ola Spurr for evaluation of an elevated PSA.  His recent PSA results are as follows:  10/2014     3.01 10/2015     3.13 10/2016     3.43 10/2017     4.7 02/2018     4.67  He denies bothersome lower urinary tract symptoms.  He denies dysuria or gross hematuria.  He has no flank, abdominal, pelvic or scrotal pain.  There is no previous history of urologic problems or prior urologic evaluation.   PMH: Past Medical History:  Diagnosis Date  . Hyperlipidemia   . Hypertension   . Sciatica    right L5 distribution- [facility] 2012    Surgical History: Past Surgical History:  Procedure Laterality Date  . COLONOSCOPY  03/25/2007   INTERNAL HEMORRHOIDS PER Dr. Deatra Ina  . [place]  2002  . LAPAROSCOPIC CHOLECYCSTECTOMY  10/30/2007   dR. bYRNETT  . RIH      Home Medications:  Allergies as of 05/11/2018      Reactions   Hctz [hydrochlorothiazide]    ED   Lisinopril    cough      Medication List        Accurate as of 05/11/18  3:48 PM. Always use your most recent med list.          aspirin 81 MG tablet Take 81 mg by mouth daily.   atorvastatin 10 MG tablet Commonly known as:  LIPITOR   lisinopril 2.5 MG tablet Commonly known as:  PRINIVIL,ZESTRIL TAKE 1 TABLET (2.5 MG TOTAL) BY MOUTH DAILY.   losartan 50 MG tablet Commonly known as:  COZAAR       Allergies:  Allergies  Allergen Reactions  . Hctz [Hydrochlorothiazide]     ED  . Lisinopril     cough    Family History: Family History  Problem Relation Age of Onset  . Hypothyroidism Mother   . Hypertension Father   . Cancer Father        AML  . Hypertension Son   . Hypertension Son   .  Hypertension Brother   . Hypertension Maternal Grandfather   . Diabetes Maternal Grandfather   . Colon cancer Maternal Aunt   . Prostate cancer Neg Hx     Social History:  reports that he has never smoked. He has never used smokeless tobacco. He reports that he drinks alcohol. He reports that he does not use drugs.  ROS: UROLOGY Frequent Urination?: Yes Hard to postpone urination?: No Burning/pain with urination?: No Get up at night to urinate?: Yes Leakage of urine?: Yes Urine stream starts and stops?: No Trouble starting stream?: No Do you have to strain to urinate?: No Blood in urine?: No Urinary tract infection?: No Sexually transmitted disease?: No Injury to kidneys or bladder?: No Painful intercourse?: No Weak stream?: No Erection problems?: No Penile pain?: No  Gastrointestinal Nausea?: No Vomiting?: No Indigestion/heartburn?: No Diarrhea?: No Constipation?: No  Constitutional Fever: No Night sweats?: No Weight loss?: No Fatigue?: No  Skin Skin rash/lesions?: No Itching?: No  Eyes Blurred vision?: No Double vision?: No  Ears/Nose/Throat Sore throat?: No Sinus problems?: No  Hematologic/Lymphatic Swollen glands?: No Easy bruising?: No  Cardiovascular Leg swelling?: No Chest pain?: No  Respiratory Cough?: No Shortness of breath?: No  Endocrine Excessive thirst?: No  Musculoskeletal Back pain?: No Joint pain?: No  Neurological Headaches?: No Dizziness?: No  Psychologic Depression?: No Anxiety?: No  Physical Exam: BP (!) 155/102   Pulse 67   Ht 5\' 9"  (1.753 m)   Wt 192 lb (87.1 kg)   BMI 28.35 kg/m   Constitutional:  Alert and oriented, No acute distress. HEENT: [place] AT, moist mucus membranes.  Trachea midline, no masses. Cardiovascular: No clubbing, cyanosis, or edema. Respiratory: Normal respiratory effort, no increased work of breathing. GI: Abdomen is soft, nontender, nondistended, no abdominal masses GU: No CVA  tenderness.  Prostate 35 g, smooth with increased left-sided firmness. Lymph: No cervical or inguinal lymphadenopathy. Skin: No rashes, bruises or suspicious lesions. Neurologic: Grossly intact, no focal deficits, moving all 4 extremities. Psychiatric: Normal mood and affect.    Assessment & Plan:   62 year old male with a mildly elevated PSA and abnormal PSA velocity.  He has an abnormal DRE with increased left-sided firmness.  I would recommend prostate biopsy based on these findings.  Although PSA is a prostate cancer screening test he was informed that cancer is not the most common cause of an elevated PSA. Other potential causes including BPH and inflammation were discussed. He was informed that the only way to adequately diagnose prostate cancer would be a transrectal ultrasound and biopsy of the prostate. The procedure was discussed including potential risks of bleeding and infection/sepsis. He was also informed that a negative biopsy does not conclusively rule out the possibility that prostate cancer may be present and that continued monitoring is required. The use of newer adjunctive blood tests including PHI and 4kScore were discussed. The use of multiparametric prostate MRI was also discussed however is not typically used for initial evaluation of an elevated PSA. Continued periodic surveillance was also discussed.  He has elected to schedule prostate biopsy.   Abbie Sons, [place] [address] [place], [place] [address] [phone]

## 2018-05-11 NOTE — Telephone Encounter (Signed)
Patient called and stated that he now wants to not have a biopsy he wants to proceed with a prostate MRI. Can you put an order in for this and do you want him to follow up for results?   Thanks, Sharyn Lull

## 2018-05-12 LAB — PSA: PROSTATE SPECIFIC AG, SERUM: 6 ng/mL — AB (ref 0.0–4.0)

## 2018-05-12 NOTE — Telephone Encounter (Signed)
The order was entered however he has not had a previous prostate biopsy.  This may not be covered by his insurance.

## 2018-05-13 ENCOUNTER — Telehealth: Payer: Self-pay | Admitting: Urology

## 2018-05-13 NOTE — Telephone Encounter (Signed)
They approved it  [place]

## 2018-05-15 ENCOUNTER — Encounter: Payer: Self-pay | Admitting: Urology

## 2018-06-03 ENCOUNTER — Ambulatory Visit
Admission: RE | Admit: 2018-06-03 | Discharge: 2018-06-03 | Disposition: A | Discharge status: Home / Self Care | Payer: BLUE CROSS/BLUE SHIELD | Source: Ambulatory Visit | Attending: Urology | Admitting: Urology

## 2018-06-03 DIAGNOSIS — R972 Elevated prostate specific antigen [PSA]: Secondary | ICD-10-CM

## 2018-06-03 MED ORDER — GADOBENATE DIMEGLUMINE 529 MG/ML IV SOLN
18.0000 mL | Freq: Once | INTRAVENOUS | Status: AC | PRN
  Administered 2018-06-03: 18 mL via INTRAVENOUS

## 2018-06-16 ENCOUNTER — Ambulatory Visit: Payer: BLUE CROSS/BLUE SHIELD | Admitting: Urology

## 2018-06-16 ENCOUNTER — Other Ambulatory Visit: Payer: BLUE CROSS/BLUE SHIELD | Admitting: Urology

## 2018-06-28 HISTORY — PX: PROSTATE BIOPSY: SHX241

## 2018-06-29 ENCOUNTER — Ambulatory Visit: Payer: BLUE CROSS/BLUE SHIELD | Admitting: Urology

## 2018-07-21 ENCOUNTER — Other Ambulatory Visit: Payer: Self-pay | Admitting: Urology

## 2018-07-21 DIAGNOSIS — C61 Malignant neoplasm of prostate: Secondary | ICD-10-CM

## 2018-07-26 ENCOUNTER — Encounter (HOSPITAL_COMMUNITY)
Admission: RE | Admit: 2018-07-26 | Discharge: 2018-07-26 | Disposition: A | Discharge status: Home / Self Care | Payer: BLUE CROSS/BLUE SHIELD | Source: Ambulatory Visit | Attending: Urology | Admitting: Urology

## 2018-07-26 ENCOUNTER — Ambulatory Visit (HOSPITAL_COMMUNITY)
Admission: RE | Admit: 2018-07-26 | Discharge: 2018-07-26 | Disposition: A | Discharge status: Home / Self Care | Payer: BLUE CROSS/BLUE SHIELD | Source: Ambulatory Visit | Attending: Urology | Admitting: Urology

## 2018-07-26 DIAGNOSIS — C61 Malignant neoplasm of prostate: Secondary | ICD-10-CM | POA: Diagnosis not present

## 2018-07-26 MED ORDER — TECHNETIUM TC 99M MEDRONATE IV KIT
20.0000 | PACK | Freq: Once | INTRAVENOUS | Status: AC | PRN
  Administered 2018-07-26: 20 via INTRAVENOUS

## 2018-08-23 ENCOUNTER — Other Ambulatory Visit: Payer: Self-pay | Admitting: Urology

## 2018-09-23 NOTE — Patient Instructions (Addendum)
Shawn Ellison  09/23/2018   Your procedure is scheduled on: 10-03-18   Report to [facility] Main  Entrance    Report to admitting at 9:00AM    Call this number if you have problems the morning of surgery [place]. GIVE TO ADMITTING ON ARRIVAL.       Remember: FOLLOW ALL BOWEL PREP ORDERS PROVIDED BY YOUR SURGEON. COMPLETE A FLEETS ENEMA THE DAY BEFORE SURGERY! Do not eat food or drink liquids :After Midnight. BRUSH YOUR TEETH MORNING OF SURGERY AND RINSE YOUR MOUTH OUT, NO CHEWING GUM CANDY OR MINTS.     Take these medicines the morning of surgery with A SIP OF WATER: atorvastatin                                  You may not have any metal on your body including hair pins and              piercings  Do not wear jewelry, make-up, lotions, powders or perfumes, deodorant                         Men may shave face and neck.    Do not bring valuables to the hospital. [place].  Contacts, dentures or bridgework may not be worn into surgery.  Leave suitcase in the car. After surgery it may be brought to your room.                   Please read over the following fact sheets you were given: _____________________________________________________________________              [facility] - Preparing for Surgery Before surgery, you can play an important role.  Because skin is not sterile, your skin needs to be as free of germs as possible.  You can reduce the number of germs on your skin by washing with CHG (chlorahexidine gluconate) soap before surgery.  CHG is an antiseptic cleaner which kills germs and bonds with the skin to continue killing germs even after washing. Please DO NOT use if you have an allergy to CHG or antibacterial soaps.  If your skin becomes reddened/irritated stop using the CHG and inform your nurse when you arrive at Short Stay. Do  not shave (including legs and underarms) for at least 48 hours prior to the first CHG shower.  You may shave your face/neck. Please follow these instructions carefully:  1.  Shower with CHG Soap the night before surgery and the  morning of Surgery.  2.  If you choose to wash your hair, wash your hair first as usual with your  normal  shampoo.  3.  After you shampoo, rinse your hair and body thoroughly to remove the  shampoo.                           4.  Use CHG as you would any other liquid soap.  You can apply chg directly  to the skin and wash  Gently with a scrungie or clean washcloth.  5.  Apply the CHG Soap to your body ONLY FROM THE NECK DOWN.   Do not use on face/ open                           Wound or open sores. Avoid contact with eyes, ears mouth and genitals (private parts).                       Wash face,  Genitals (private parts) with your normal soap.             6.  Wash thoroughly, paying special attention to the area where your surgery  will be performed.  7.  Thoroughly rinse your body with warm water from the neck down.  8.  DO NOT shower/wash with your normal soap after using and rinsing off  the CHG Soap.                9.  Pat yourself dry with a clean towel.            10.  Wear clean pajamas.            11.  Place clean sheets on your bed the night of your first shower and do not  sleep with pets. Day of Surgery : Do not apply any lotions/deodorants the morning of surgery.  Please wear clean clothes to the hospital/surgery center.  FAILURE TO FOLLOW THESE INSTRUCTIONS MAY RESULT IN THE CANCELLATION OF YOUR SURGERY PATIENT SIGNATURE_________________________________  NURSE SIGNATURE__________________________________  ________________________________________________________________________   Adam Phenix  An incentive spirometer is a tool that can help keep your lungs clear and active. This tool measures how well you are filling your  lungs with each breath. Taking long deep breaths may help reverse or decrease the chance of developing breathing (pulmonary) problems (especially infection) following:  A long period of time when you are unable to move or be active. BEFORE THE PROCEDURE   If the spirometer includes an indicator to show your best effort, your nurse or respiratory therapist will set it to a desired goal.  If possible, sit up straight or lean slightly forward. Try not to slouch.  Hold the incentive spirometer in an upright position. INSTRUCTIONS FOR USE  1. Sit on the edge of your bed if possible, or sit up as far as you can in bed or on a chair. 2. Hold the incentive spirometer in an upright position. 3. Breathe out normally. 4. Place the mouthpiece in your mouth and seal your lips tightly around it. 5. Breathe in slowly and as deeply as possible, raising the piston or the ball toward the top of the column. 6. Hold your breath for 3-5 seconds or for as long as possible. Allow the piston or ball to fall to the bottom of the column. 7. Remove the mouthpiece from your mouth and breathe out normally. 8. Rest for a few seconds and repeat Steps 1 through 7 at least 10 times every 1-2 hours when you are awake. Take your time and take a few normal breaths between deep breaths. 9. The spirometer may include an indicator to show your best effort. Use the indicator as a goal to work toward during each repetition. 10. After each set of 10 deep breaths, practice coughing to be sure your lungs are clear. If you have an incision (the cut made at the time of surgery),  support your incision when coughing by placing a pillow or rolled up towels firmly against it. Once you are able to get out of bed, walk around indoors and cough well. You may stop using the incentive spirometer when instructed by your caregiver.  RISKS AND COMPLICATIONS  Take your time so you do not get dizzy or light-headed.  If you are in pain, you may need  to take or ask for pain medication before doing incentive spirometry. It is harder to take a deep breath if you are having pain. AFTER USE  Rest and breathe slowly and easily.  It can be helpful to keep track of a log of your progress. Your caregiver can provide you with a simple table to help with this. If you are using the spirometer at home, follow these instructions: [place] IF:   You are having difficultly using the spirometer.  You have trouble using the spirometer as often as instructed.  Your pain medication is not giving enough relief while using the spirometer.  You develop fever of 100.5 F (38.1 C) or higher. SEEK IMMEDIATE MEDICAL CARE IF:   You cough up bloody sputum that had not been present before.  You develop fever of 102 F (38.9 C) or greater.  You develop worsening pain at or near the incision site. MAKE SURE YOU:   Understand these instructions.  Will watch your condition.  Will get help right away if you are not doing well or get worse. Document Released: 01/25/2007 Document Revised: 12/07/2011 Document Reviewed: 03/28/2007 [facility] Patient Information 2014 [place], [place].   ________________________________________________________________________

## 2018-09-26 ENCOUNTER — Other Ambulatory Visit: Payer: Self-pay

## 2018-09-26 ENCOUNTER — Encounter (HOSPITAL_COMMUNITY): Payer: Self-pay

## 2018-09-26 ENCOUNTER — Encounter (HOSPITAL_COMMUNITY)
Admission: RE | Admit: 2018-09-26 | Discharge: 2018-09-26 | Disposition: A | Discharge status: Home / Self Care | Payer: BLUE CROSS/BLUE SHIELD | Source: Ambulatory Visit | Attending: Urology | Admitting: Urology

## 2018-09-26 DIAGNOSIS — Z01818 Encounter for other preprocedural examination: Secondary | ICD-10-CM | POA: Insufficient documentation

## 2018-09-26 LAB — BASIC METABOLIC PANEL
Anion gap: 7 (ref 5–15)
BUN: 19 mg/dL (ref 8–23)
CO2: 26 mmol/L (ref 22–32)
Calcium: 9.2 mg/dL (ref 8.9–10.3)
Chloride: 107 mmol/L (ref 98–111)
Creatinine, Ser: 0.82 mg/dL (ref 0.61–1.24)
GFR calc Af Amer: >60 mL/min (ref >60)
GFR calc non Af Amer: >60 mL/min (ref >60)
Glucose, Bld: 108 mg/dL — ABNORMAL HIGH (ref 70–99)
Potassium: 4.1 mmol/L (ref 3.5–5.1)
Sodium: 140 mmol/L (ref 135–145)

## 2018-09-26 LAB — CBC
HCT: 47.7 % (ref 39.0–52.0)
Hemoglobin: 16.1 g/dL (ref 13.0–17.0)
MCH: 31.1 pg (ref 26.0–34.0)
MCHC: 33.8 g/dL (ref 30.0–36.0)
MCV: 92.3 fL (ref 80.0–100.0)
Platelets: 164 10*3/uL (ref 150–400)
RBC: 5.17 MIL/uL (ref 4.22–5.81)
RDW: 12.1 % (ref 11.5–15.5)
WBC: 5.3 10*3/uL (ref 4.0–10.5)
nRBC: 0 % (ref 0.0–0.2)

## 2018-09-26 LAB — ABO/RH: ABO/RH(D): O POS

## 2018-09-27 NOTE — H&P (Signed)
Office Visit Report     09/27/2018   --------------------------------------------------------------------------------   Shawn Ellison  MRN: [id number]  PRIMARY CARE:  Leonie Douglas. Shawn Hutching, MD  DOB: 1956-02-04, 62 year old Male  REFERRING:  Leonie Douglas. Shawn Hutching, MD  SSN:   PROVIDER:  Raynelle Ellison, M.D.    TREATING:  Shawn Ellison, [place]    LOCATION:  [facility] [phone]   --------------------------------------------------------------------------------   CC/HPI: Pt presents today for pre-operative history and physical exam in anticipation of robotic assisted laparoscopic prostatectomy with bilateral pelvic lymph node dissection by Dr. Alinda Ellison on 10/03/18. He is doing well and without complaint. He has been to PT and the class at the hospital and feels very good about proceeding with surgery.   Pt denies F/C, HA, CP, SOB, N/V, diarrhea/constipation, flank pain, hematuria, and dysuria.    HX:   CC: Prostate Cancer   PCP: Dr. Fulton Ellison   Mr. Shawn Ellison is a 62 year old gentleman who was noted to have an elevated PSA of 6.0 and induration of the left lateral/base prostate. He underwent an MRI of the prostate on 06/03/18 that demonstrated a 1.0 cm PI-RADS 4 lesion at the left mid prostate. 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   Family history: None   Imaging studies:  Bone scan (07/26/18): Negative for metastatic disease  MRI (06/03/18): No EPE, SVI, LAD or other evidence of metastatic disease.   PMH: He has a history of hypertension and hyperlipidemia.  PSH: Inguinal hernia repair, laparoscopic cholecystectomy.   TNM stage: cT2b N0 M0  PSA: 6.0  Gleason score: 4+4=8  Biopsy (07/16/18): 6/16 cores positive  Left: L lateral apex (10%, 4+4=8), L lateral mid (70%, 4+4=8, PNI), L mid (60%, 4+3=7), L lateral base (90%, 4+3=7, PNI), L base (20%, 4+4=8, PNI)  Right:  Benign  MR targeted biopsies: 1/4 positive (7%, 4+3=7, PNI)  Prostate volume: 42.5 cc   Nomogram  OC disease: 22%  EPE: 75%  SVI: 17%  LNI: 21%  PFS (5 year, 10 year): 39%, 26%   Urinary function: IPSS is 8.  Erectile function: SHIM score is 24.     ALLERGIES: None   MEDICATIONS: Aspirin  Atorvastatin Calcium  Losartan Potassium     GU PSH: Prostate Needle Biopsy - 07/13/2018    NON-GU PSH: Hernia Repair Parathyroidectomy Remove Gallbladder Surgical Pathology, Gross And Microscopic Examination For Prostate Needle - 07/13/2018    GU PMH: Elevated PSA - 06/27/2018 Prostate Cancer      PMH Notes:   1) Elevated PSA/abnormal DRE: He presented to me in September 2019 with an elevated PSA of 6.0. An MRI of the prostate had been performed prior to his visit with me indicating a 10 x 7 mm PI-RADS 4 lesion of the left mid prostate.   NON-GU PMH: Muscle weakness (generalized) - 09/15/2018, - 08/22/2018 Hypercholesterolemia Hyperparathyroidism Hypertension    FAMILY HISTORY: None    Notes: 2 sons    SOCIAL HISTORY: Marital Status: Married Preferred Language: English; Ethnicity: Not Hispanic Or Latino; Race: White Current Smoking Status: Patient has never smoked.   Tobacco Use Assessment Completed: Used Tobacco in last 30 days? Does not use smokeless tobacco. Drinks 2 drinks per day. Types of alcohol consumed: Liquor. Social Drinker.  Does not use drugs. Does not drink caffeine. Has not had a blood transfusion. 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     Notes: ETOH  mixed drink max 2/night    REVIEW OF SYSTEMS:    GU Review Male:   Patient reports frequent urination and get up at night to urinate. Patient denies hard to postpone urination, burning/ pain with urination, leakage of urine, stream starts and stops, trouble starting your stream, have to strain to urinate , erection problems, and penile pain.  Gastrointestinal (Upper):   Patient denies vomiting, nausea, and  indigestion/ heartburn.  Gastrointestinal (Lower):   Patient denies diarrhea and constipation.  Constitutional:   Patient denies fever, night sweats, weight loss, and fatigue.  Skin:   Patient denies skin rash/ lesion and itching.  Eyes:   Patient denies blurred vision and double vision.  Ears/ Nose/ Throat:   Patient denies sore throat and sinus problems.  Hematologic/Lymphatic:   Patient denies swollen glands and easy bruising.  Cardiovascular:   Patient denies leg swelling and chest pains.  Respiratory:   Patient denies cough and shortness of breath.  Endocrine:   Patient denies excessive thirst.  Musculoskeletal:   Patient reports back pain. Patient denies joint pain.  Neurological:   Patient denies headaches and dizziness.  Psychologic:   Patient denies depression and anxiety.   VITAL SIGNS:      09/27/2018 02:02 PM  Weight 198 lb / 89.81 kg  Height 69 in / 175.26 cm  BP 133/85 mmHg  Pulse 76 /min  Temperature 97.6 F / 36.4 C  BMI 29.2 kg/m   MULTI-SYSTEM PHYSICAL EXAMINATION:    Constitutional: Well-nourished. No physical deformities. Normally developed. Good grooming.  Neck: Neck symmetrical, not swollen. Normal tracheal position.  Respiratory: Normal breath sounds. No labored breathing, no use of accessory muscles.   Cardiovascular: Regular rate and rhythm. No murmur, no gallop. Normal temperature, normal extremity pulses  Lymphatic: No enlargement of neck, axillae, groin.  Skin: No paleness, no jaundice, no cyanosis. No lesion, no ulcer, no rash.  Neurologic / Psychiatric: Oriented to time, oriented to place, oriented to person. No depression, no anxiety, no agitation.  Gastrointestinal: No mass, no tenderness, no rigidity, non obese abdomen.  Eyes: Normal conjunctivae. Normal eyelids.  Ears, Nose, Mouth, and Throat: Left ear no scars, no lesions, no masses. Right ear no scars, no lesions, no masses. Nose no scars, no lesions, no masses. Normal hearing. Normal lips.   Musculoskeletal: Normal gait and station of head and neck.     PAST DATA REVIEWED:  Source Of History:  Patient  Records Review:   Previous Patient Records  Urine Test Review:   Urinalysis   09/27/18  Urinalysis  Urine Appearance Clear   Urine Color Yellow   Urine Glucose Neg mg/dL  Urine Bilirubin Neg mg/dL  Urine Ketones Neg mg/dL  Urine Specific Gravity 1.025   Urine Blood Neg ery/uL  Urine pH 5.5   Urine Protein Neg mg/dL  Urine Urobilinogen 0.2 mg/dL  Urine Nitrites Neg   Urine Leukocyte Esterase Neg leu/uL   PROCEDURES:          Urinalysis - 81003 Dipstick Dipstick Cont'd  Color: Yellow Bilirubin: Neg mg/dL  Appearance: Clear Ketones: Neg mg/dL  Specific Gravity: 1.025 Blood: Neg ery/uL  pH: 5.5 Protein: Neg mg/dL  Glucose: Neg mg/dL Urobilinogen: 0.2 mg/dL    Nitrites: Neg    Leukocyte Esterase: Neg leu/uL    ASSESSMENT:      ICD-10 Details  1 GU:   Prostate Cancer - C61    PLAN:           Schedule Return Visit/Planned Activity:  Keep Scheduled Appointment - Schedule Surgery          Document Letter(s):  Created for Patient: Clinical Summary         Notes:   There are no changes in the patients history or physical exam since last evaluation by Dr. Alinda Ellison. Pt is scheduled to undergo RALP with BPLND on 10/03/18.   All pt's questions were answered to the best of my ability.          Next Appointment:      Next Appointment: 10/03/2018 11:30 AM    Appointment Type: Surgery     Location: [facility] [phone]    Provider: Raynelle Ellison, M.D.    Reason for Visit: WL/EXT REC RA LAP RAD PROSTATECTOMY LEVEL 2 , BPLA WITH AMANDA      * Signed by Shawn Rossetti, PA on 09/27/18 at 3:04 PM (EST)*

## 2018-10-03 ENCOUNTER — Other Ambulatory Visit: Payer: Self-pay

## 2018-10-03 ENCOUNTER — Ambulatory Visit (HOSPITAL_COMMUNITY): Payer: BLUE CROSS/BLUE SHIELD | Admitting: Physician Assistant

## 2018-10-03 ENCOUNTER — Ambulatory Visit (HOSPITAL_COMMUNITY): Payer: BLUE CROSS/BLUE SHIELD | Admitting: Registered Nurse

## 2018-10-03 ENCOUNTER — Encounter (HOSPITAL_COMMUNITY)
Admission: RE | Disposition: A | Discharge status: Home / Self Care | Payer: Self-pay | Source: Home / Self Care | Attending: Urology

## 2018-10-03 ENCOUNTER — Observation Stay (HOSPITAL_COMMUNITY)
Admission: RE | Admit: 2018-10-03 | Discharge: 2018-10-04 | Disposition: A | Discharge status: Home / Self Care | Payer: BLUE CROSS/BLUE SHIELD | Attending: Urology | Admitting: Urology

## 2018-10-03 ENCOUNTER — Encounter (HOSPITAL_COMMUNITY): Payer: Self-pay | Admitting: Registered Nurse

## 2018-10-03 DIAGNOSIS — Z79899 Other long term (current) drug therapy: Secondary | ICD-10-CM | POA: Insufficient documentation

## 2018-10-03 DIAGNOSIS — E785 Hyperlipidemia, unspecified: Secondary | ICD-10-CM | POA: Diagnosis not present

## 2018-10-03 DIAGNOSIS — I10 Essential (primary) hypertension: Secondary | ICD-10-CM | POA: Insufficient documentation

## 2018-10-03 DIAGNOSIS — C61 Malignant neoplasm of prostate: Secondary | ICD-10-CM | POA: Diagnosis not present

## 2018-10-03 DIAGNOSIS — Z7982 Long term (current) use of aspirin: Secondary | ICD-10-CM | POA: Diagnosis not present

## 2018-10-03 HISTORY — PX: ROBOT ASSISTED LAPAROSCOPIC RADICAL PROSTATECTOMY: SHX5141

## 2018-10-03 HISTORY — PX: LYMPHADENECTOMY: SHX5960

## 2018-10-03 LAB — HEMOGLOBIN AND HEMATOCRIT, BLOOD
HCT: 44.9 % (ref 39.0–52.0)
Hemoglobin: 15.2 g/dL (ref 13.0–17.0)

## 2018-10-03 LAB — TYPE AND SCREEN
ABO/RH(D): O POS
Antibody Screen: NEGATIVE

## 2018-10-03 SURGERY — XI ROBOTIC ASSISTED LAPAROSCOPIC RADICAL PROSTATECTOMY LEVEL 2
Anesthesia: General

## 2018-10-03 MED ORDER — LIDOCAINE 2% (20 MG/ML) 5 ML SYRINGE
INTRAMUSCULAR | Status: AC
  Filled 2018-10-03: qty 5

## 2018-10-03 MED ORDER — MIDAZOLAM HCL 5 MG/5ML IJ SOLN
INTRAMUSCULAR | Status: DC | PRN
  Administered 2018-10-03: 2 mg via INTRAVENOUS

## 2018-10-03 MED ORDER — FENTANYL CITRATE (PF) 250 MCG/5ML IJ SOLN
INTRAMUSCULAR | Status: AC
  Filled 2018-10-03: qty 5

## 2018-10-03 MED ORDER — HYDROMORPHONE HCL 1 MG/ML IJ SOLN
INTRAMUSCULAR | Status: AC
  Filled 2018-10-03: qty 2

## 2018-10-03 MED ORDER — ZOLPIDEM TARTRATE 5 MG PO TABS
5.0000 mg | ORAL_TABLET | Freq: Every evening | ORAL | Status: DC | PRN
  Administered 2018-10-03: 5 mg via ORAL
  Filled 2018-10-03: qty 1

## 2018-10-03 MED ORDER — MIDAZOLAM HCL 2 MG/2ML IJ SOLN
INTRAMUSCULAR | Status: AC
  Filled 2018-10-03: qty 2

## 2018-10-03 MED ORDER — TRAMADOL HCL 50 MG PO TABS: 50.0000 mg | ORAL_TABLET | Freq: Four times a day (QID) | ORAL | 0 refills | Status: DC | PRN

## 2018-10-03 MED ORDER — ACETAMINOPHEN 325 MG PO TABS: 650.0000 mg | ORAL_TABLET | ORAL | Status: DC | PRN

## 2018-10-03 MED ORDER — BUPIVACAINE HCL (PF) 0.25 % IJ SOLN
INTRAMUSCULAR | Status: AC
  Filled 2018-10-03: qty 30

## 2018-10-03 MED ORDER — ATORVASTATIN CALCIUM 10 MG PO TABS
10.0000 mg | ORAL_TABLET | Freq: Every day | ORAL | Status: DC
  Administered 2018-10-04: 10 mg via ORAL
  Filled 2018-10-03 (×2): qty 1

## 2018-10-03 MED ORDER — SUGAMMADEX SODIUM 200 MG/2ML IV SOLN
INTRAVENOUS | Status: DC | PRN
  Administered 2018-10-03: 200 mg via INTRAVENOUS

## 2018-10-03 MED ORDER — PHENYLEPHRINE 40 MCG/ML (10ML) SYRINGE FOR IV PUSH (FOR BLOOD PRESSURE SUPPORT)
PREFILLED_SYRINGE | INTRAVENOUS | Status: AC
  Filled 2018-10-03: qty 10

## 2018-10-03 MED ORDER — DIPHENHYDRAMINE HCL 50 MG/ML IJ SOLN: 12.5000 mg | Freq: Four times a day (QID) | INTRAMUSCULAR | Status: DC | PRN

## 2018-10-03 MED ORDER — CEFAZOLIN SODIUM-DEXTROSE 2-4 GM/100ML-% IV SOLN
INTRAVENOUS | Status: AC
  Filled 2018-10-03: qty 100

## 2018-10-03 MED ORDER — KCL IN DEXTROSE-NACL 20-5-0.45 MEQ/L-%-% IV SOLN
INTRAVENOUS | Status: DC
  Administered 2018-10-03 – 2018-10-04 (×2): via INTRAVENOUS
  Filled 2018-10-03 (×3): qty 1000

## 2018-10-03 MED ORDER — HYDROMORPHONE HCL 1 MG/ML IJ SOLN
0.2500 mg | INTRAMUSCULAR | Status: DC | PRN
  Administered 2018-10-03 (×4): 0.5 mg via INTRAVENOUS

## 2018-10-03 MED ORDER — KETOROLAC TROMETHAMINE 15 MG/ML IJ SOLN
15.0000 mg | Freq: Four times a day (QID) | INTRAMUSCULAR | Status: DC
  Administered 2018-10-03 – 2018-10-04 (×4): 15 mg via INTRAVENOUS
  Filled 2018-10-03 (×4): qty 1

## 2018-10-03 MED ORDER — ROCURONIUM BROMIDE 10 MG/ML (PF) SYRINGE
PREFILLED_SYRINGE | INTRAVENOUS | Status: DC | PRN
  Administered 2018-10-03: 70 mg via INTRAVENOUS
  Administered 2018-10-03 (×2): 20 mg via INTRAVENOUS

## 2018-10-03 MED ORDER — BELLADONNA ALKALOIDS-OPIUM 16.2-60 MG RE SUPP
RECTAL | Status: AC
  Filled 2018-10-03: qty 1

## 2018-10-03 MED ORDER — ARTIFICIAL TEARS OPHTHALMIC OINT
TOPICAL_OINTMENT | OPHTHALMIC | Status: DC | PRN
  Administered 2018-10-03: 19:00:00 via OPHTHALMIC
  Filled 2018-10-03 (×2): qty 3.5

## 2018-10-03 MED ORDER — CEFAZOLIN SODIUM-DEXTROSE 1-4 GM/50ML-% IV SOLN
1.0000 g | Freq: Three times a day (TID) | INTRAVENOUS | Status: AC
  Administered 2018-10-03 – 2018-10-04 (×2): 1 g via INTRAVENOUS
  Filled 2018-10-03 (×2): qty 50

## 2018-10-03 MED ORDER — PROMETHAZINE HCL 25 MG/ML IJ SOLN: 6.2500 mg | INTRAMUSCULAR | Status: DC | PRN

## 2018-10-03 MED ORDER — CEFAZOLIN SODIUM-DEXTROSE 2-4 GM/100ML-% IV SOLN
2.0000 g | Freq: Once | INTRAVENOUS | Status: AC
  Administered 2018-10-03: 2 g via INTRAVENOUS

## 2018-10-03 MED ORDER — HEPARIN SODIUM (PORCINE) 1000 UNIT/ML IJ SOLN
INTRAMUSCULAR | Status: AC
  Filled 2018-10-03: qty 1

## 2018-10-03 MED ORDER — ONDANSETRON HCL 4 MG/2ML IJ SOLN: 4.0000 mg | INTRAMUSCULAR | Status: DC | PRN

## 2018-10-03 MED ORDER — MAGNESIUM CITRATE PO SOLN: 1.0000 | Freq: Once | ORAL | Status: DC

## 2018-10-03 MED ORDER — BUPIVACAINE-EPINEPHRINE (PF) 0.25% -1:200000 IJ SOLN
INTRAMUSCULAR | Status: AC
  Filled 2018-10-03: qty 30

## 2018-10-03 MED ORDER — SODIUM CHLORIDE 0.9 % IV BOLUS
1000.0000 mL | Freq: Once | INTRAVENOUS | Status: AC
  Administered 2018-10-03: 1000 mL via INTRAVENOUS

## 2018-10-03 MED ORDER — DOCUSATE SODIUM 100 MG PO CAPS
100.0000 mg | ORAL_CAPSULE | Freq: Two times a day (BID) | ORAL | Status: DC
  Administered 2018-10-03 – 2018-10-04 (×2): 100 mg via ORAL
  Filled 2018-10-03 (×2): qty 1

## 2018-10-03 MED ORDER — ACETAMINOPHEN 10 MG/ML IV SOLN
INTRAVENOUS | Status: AC
  Filled 2018-10-03: qty 100

## 2018-10-03 MED ORDER — ACETAMINOPHEN 10 MG/ML IV SOLN
1000.0000 mg | Freq: Once | INTRAVENOUS | Status: DC | PRN
  Administered 2018-10-03: 1000 mg via INTRAVENOUS

## 2018-10-03 MED ORDER — ONDANSETRON HCL 4 MG/2ML IJ SOLN
INTRAMUSCULAR | Status: AC
  Filled 2018-10-03: qty 2

## 2018-10-03 MED ORDER — BACITRACIN-NEOMYCIN-POLYMYXIN 400-5-5000 EX OINT: 1.0000 "application " | TOPICAL_OINTMENT | Freq: Three times a day (TID) | CUTANEOUS | Status: DC | PRN

## 2018-10-03 MED ORDER — SUGAMMADEX SODIUM 200 MG/2ML IV SOLN
INTRAVENOUS | Status: AC
  Filled 2018-10-03: qty 2

## 2018-10-03 MED ORDER — LOSARTAN POTASSIUM 50 MG PO TABS
50.0000 mg | ORAL_TABLET | Freq: Every day | ORAL | Status: DC
  Administered 2018-10-03 – 2018-10-04 (×2): 50 mg via ORAL
  Filled 2018-10-03 (×2): qty 1

## 2018-10-03 MED ORDER — PROPOFOL 10 MG/ML IV BOLUS
INTRAVENOUS | Status: AC
  Filled 2018-10-03: qty 20

## 2018-10-03 MED ORDER — PROPOFOL 10 MG/ML IV BOLUS
INTRAVENOUS | Status: DC | PRN
  Administered 2018-10-03: 150 mg via INTRAVENOUS

## 2018-10-03 MED ORDER — LACTATED RINGERS IV SOLN
INTRAVENOUS | Status: DC
  Administered 2018-10-03 (×2): via INTRAVENOUS

## 2018-10-03 MED ORDER — LACTATED RINGERS IV SOLN
INTRAVENOUS | Status: DC | PRN
  Administered 2018-10-03: 1000 mL

## 2018-10-03 MED ORDER — ROCURONIUM BROMIDE 10 MG/ML (PF) SYRINGE
PREFILLED_SYRINGE | INTRAVENOUS | Status: AC
  Filled 2018-10-03: qty 10

## 2018-10-03 MED ORDER — SODIUM CHLORIDE 0.9 % IR SOLN
Status: DC | PRN
  Administered 2018-10-03: 1000 mL via INTRAVESICAL

## 2018-10-03 MED ORDER — BELLADONNA ALKALOIDS-OPIUM 16.2-60 MG RE SUPP
1.0000 | Freq: Four times a day (QID) | RECTAL | Status: DC | PRN
  Administered 2018-10-03: 1 via RECTAL

## 2018-10-03 MED ORDER — MORPHINE SULFATE (PF) 2 MG/ML IV SOLN: 2.0000 mg | INTRAVENOUS | Status: DC | PRN

## 2018-10-03 MED ORDER — DEXAMETHASONE SODIUM PHOSPHATE 10 MG/ML IJ SOLN
INTRAMUSCULAR | Status: AC
  Filled 2018-10-03: qty 1

## 2018-10-03 MED ORDER — FENTANYL CITRATE (PF) 250 MCG/5ML IJ SOLN
INTRAMUSCULAR | Status: DC | PRN
  Administered 2018-10-03: 50 ug via INTRAVENOUS
  Administered 2018-10-03: 100 ug via INTRAVENOUS
  Administered 2018-10-03 (×4): 50 ug via INTRAVENOUS
  Administered 2018-10-03: 100 ug via INTRAVENOUS
  Administered 2018-10-03: 50 ug via INTRAVENOUS

## 2018-10-03 MED ORDER — LIDOCAINE 2% (20 MG/ML) 5 ML SYRINGE
INTRAMUSCULAR | Status: DC | PRN
  Administered 2018-10-03: 100 mg via INTRAVENOUS

## 2018-10-03 MED ORDER — BUPIVACAINE-EPINEPHRINE 0.25% -1:200000 IJ SOLN
INTRAMUSCULAR | Status: DC | PRN
  Administered 2018-10-03: 30 mL

## 2018-10-03 MED ORDER — FLEET ENEMA 7-19 GM/118ML RE ENEM: 1.0000 | ENEMA | Freq: Once | RECTAL | Status: DC

## 2018-10-03 MED ORDER — PHENYLEPHRINE 40 MCG/ML (10ML) SYRINGE FOR IV PUSH (FOR BLOOD PRESSURE SUPPORT)
PREFILLED_SYRINGE | INTRAVENOUS | Status: DC | PRN
  Administered 2018-10-03: 80 ug via INTRAVENOUS

## 2018-10-03 MED ORDER — ONDANSETRON HCL 4 MG/2ML IJ SOLN
INTRAMUSCULAR | Status: DC | PRN
  Administered 2018-10-03: 4 mg via INTRAVENOUS

## 2018-10-03 MED ORDER — DIPHENHYDRAMINE HCL 12.5 MG/5ML PO ELIX: 12.5000 mg | ORAL_SOLUTION | Freq: Four times a day (QID) | ORAL | Status: DC | PRN

## 2018-10-03 MED ORDER — DEXAMETHASONE SODIUM PHOSPHATE 10 MG/ML IJ SOLN
INTRAMUSCULAR | Status: DC | PRN
  Administered 2018-10-03: 10 mg via INTRAVENOUS

## 2018-10-03 MED ORDER — SULFAMETHOXAZOLE-TRIMETHOPRIM 800-160 MG PO TABS: 1.0000 | ORAL_TABLET | Freq: Two times a day (BID) | ORAL | 0 refills | Status: DC

## 2018-10-03 SURGICAL SUPPLY — 56 items
APPLICATOR COTTON TIP 6 STRL (MISCELLANEOUS) ×2 IMPLANT
APPLICATOR COTTON TIP 6IN STRL (MISCELLANEOUS) ×4
CATH FOLEY 2WAY SLVR 18FR 30CC (CATHETERS) ×4 IMPLANT
CATH ROBINSON RED A/P 16FR (CATHETERS) ×4 IMPLANT
CATH ROBINSON RED A/P 8FR (CATHETERS) ×4 IMPLANT
CATH TIEMANN FOLEY 18FR 5CC (CATHETERS) ×4 IMPLANT
CHLORAPREP W/TINT 26ML (MISCELLANEOUS) ×4 IMPLANT
CLIP VESOLOCK LG 6/CT PURPLE (CLIP) ×8 IMPLANT
COVER SURGICAL LIGHT HANDLE (MISCELLANEOUS) ×4 IMPLANT
COVER TIP SHEARS 8 DVNC (MISCELLANEOUS) ×2 IMPLANT
COVER TIP SHEARS 8MM DA VINCI (MISCELLANEOUS) ×2
COVER WAND RF STERILE (DRAPES) ×2 IMPLANT
CUTTER ECHEON FLEX ENDO 45 340 (ENDOMECHANICALS) ×4 IMPLANT
DECANTER SPIKE VIAL GLASS SM (MISCELLANEOUS) ×4 IMPLANT
DERMABOND ADVANCED (GAUZE/BANDAGES/DRESSINGS) ×2
DERMABOND ADVANCED .7 DNX12 (GAUZE/BANDAGES/DRESSINGS) IMPLANT
DRAPE ARM DVNC X/XI (DISPOSABLE) ×8 IMPLANT
DRAPE COLUMN DVNC XI (DISPOSABLE) ×2 IMPLANT
DRAPE DA VINCI XI ARM (DISPOSABLE) ×8
DRAPE DA VINCI XI COLUMN (DISPOSABLE) ×2
DRAPE SURG IRRIG POUCH 19X23 (DRAPES) ×4 IMPLANT
DRSG TEGADERM 4X4.75 (GAUZE/BANDAGES/DRESSINGS) ×4 IMPLANT
ELECT REM PT RETURN 15FT ADLT (MISCELLANEOUS) ×4 IMPLANT
GLOVE BIO SURGEON STRL SZ 6.5 (GLOVE) ×3 IMPLANT
GLOVE BIO SURGEONS STRL SZ 6.5 (GLOVE) ×1
GLOVE BIOGEL M STRL SZ7.5 (GLOVE) ×8 IMPLANT
GOWN STRL REUS W/TWL LRG LVL3 (GOWN DISPOSABLE) ×12 IMPLANT
HOLDER FOLEY CATH W/STRAP (MISCELLANEOUS) ×4 IMPLANT
IRRIG SUCT STRYKERFLOW 2 WTIP (MISCELLANEOUS) ×4
IRRIGATION SUCT STRKRFLW 2 WTP (MISCELLANEOUS) ×2 IMPLANT
IV LACTATED RINGERS 1000ML (IV SOLUTION) ×4 IMPLANT
NDL SAFETY ECLIPSE 18X1.5 (NEEDLE) ×2 IMPLANT
NEEDLE HYPO 18GX1.5 SHARP (NEEDLE) ×2
PACK ROBOT UROLOGY CUSTOM (CUSTOM PROCEDURE TRAY) ×4 IMPLANT
RELOAD STAPLE 45 4.1 GRN THCK (STAPLE) ×2 IMPLANT
SEAL CANN UNIV 5-8 DVNC XI (MISCELLANEOUS) ×8 IMPLANT
SEAL XI 5MM-8MM UNIVERSAL (MISCELLANEOUS) ×8
SOLUTION ELECTROLUBE (MISCELLANEOUS) ×4 IMPLANT
STAPLE RELOAD 45 GRN (STAPLE) ×2 IMPLANT
STAPLE RELOAD 45MM GREEN (STAPLE) ×2
SUT ETHILON 3 0 PS 1 (SUTURE) ×4 IMPLANT
SUT MNCRL 3 0 RB1 (SUTURE) ×2 IMPLANT
SUT MNCRL 3 0 VIOLET RB1 (SUTURE) ×2 IMPLANT
SUT MNCRL AB 4-0 PS2 18 (SUTURE) ×8 IMPLANT
SUT MONOCRYL 3 0 RB1 (SUTURE) ×4
SUT VIC AB 0 CT1 27 (SUTURE) ×2
SUT VIC AB 0 CT1 27XBRD ANTBC (SUTURE) ×2 IMPLANT
SUT VIC AB 0 UR5 27 (SUTURE) ×4 IMPLANT
SUT VIC AB 2-0 SH 27 (SUTURE) ×2
SUT VIC AB 2-0 SH 27X BRD (SUTURE) ×2 IMPLANT
SUT VICRYL 0 UR6 27IN ABS (SUTURE) ×8 IMPLANT
SYR 27GX1/2 1ML LL SAFETY (SYRINGE) ×4 IMPLANT
TOWEL OR 17X26 10 PK STRL BLUE (TOWEL DISPOSABLE) ×4 IMPLANT
TOWEL OR NON WOVEN STRL DISP B (DISPOSABLE) ×4 IMPLANT
TUBING INSUFFLATION 10FT LAP (TUBING) IMPLANT
WATER STERILE IRR 1000ML POUR (IV SOLUTION) ×8 IMPLANT

## 2018-10-03 NOTE — Anesthesia Procedure Notes (Signed)
Procedure Name: Intubation Date/Time: 10/03/2018 11:56 AM Performed by: Talbot Grumbling, CRNA Pre-anesthesia Checklist: Patient identified, Emergency Drugs available, Patient being monitored and Suction available Patient Re-evaluated:Patient Re-evaluated prior to induction Oxygen Delivery Method: Circle system utilized Preoxygenation: Pre-oxygenation with 100% oxygen Induction Type: IV induction Ventilation: Mask ventilation without difficulty Laryngoscope Size: Mac and 3 Grade View: Grade I Tube type: Oral Tube size: 7.5 mm Number of attempts: 1 Airway Equipment and Method: Stylet Placement Confirmation: ETT inserted through vocal cords under direct vision,  positive ETCO2 and breath sounds checked- equal and bilateral Secured at: 22 cm Tube secured with: Tape Dental Injury: Teeth and Oropharynx as per pre-operative assessment

## 2018-10-03 NOTE — Anesthesia Postprocedure Evaluation (Signed)
Anesthesia Post Note  Patient: Shawn Ellison  Procedure(s) Performed: XI ROBOTIC ASSISTED LAPAROSCOPIC RADICAL PROSTATECTOMY LEVEL 2 (N/A ) LYMPHADENECTOMY, PELVIC (Bilateral )     Patient location during evaluation: PACU Anesthesia Type: General Level of consciousness: awake and alert Pain management: pain level controlled Vital Signs Assessment: post-procedure vital signs reviewed and stable Respiratory status: spontaneous breathing, nonlabored ventilation, respiratory function stable and patient connected to nasal cannula oxygen Cardiovascular status: blood pressure returned to baseline and stable Postop Assessment: no apparent nausea or vomiting Anesthetic complications: no    Last Vitals:  Vitals:   10/03/18 1641 10/03/18 1804  BP: (!) 161/96 (!) 158/94  Pulse: 74 78  Resp: 14 12  Temp: 36.4 C 36.6 C  SpO2: 98% 96%    Last Pain:  Vitals:   10/03/18 1804  TempSrc: Oral  PainSc:                  Hoa Briggs S

## 2018-10-03 NOTE — Anesthesia Preprocedure Evaluation (Addendum)
Anesthesia Evaluation  Patient identified by MRN, date of birth, ID band Patient awake    Reviewed: Allergy & Precautions, NPO status , Patient's Chart, lab work & pertinent test results  Airway Mallampati: II  TM Distance: >3 FB Neck ROM: Full    Dental  (+) Chipped, Dental Advisory Given   Pulmonary neg pulmonary ROS,    Pulmonary exam normal breath sounds clear to auscultation       Cardiovascular hypertension, Pt. on medications Normal cardiovascular exam Rhythm:Regular Rate:Normal     Neuro/Psych negative neurological ROS  negative psych ROS   GI/Hepatic negative GI ROS, Neg liver ROS,   Endo/Other  negative endocrine ROS  Renal/GU negative Renal ROS  negative genitourinary   Musculoskeletal negative musculoskeletal ROS (+)   Abdominal   Peds negative pediatric ROS (+)  Hematology negative hematology ROS (+)   Anesthesia Other Findings   Reproductive/Obstetrics negative OB ROS                            Anesthesia Physical Anesthesia Plan  ASA: II  Anesthesia Plan: General   Post-op Pain Management:    Induction: Intravenous  PONV Risk Score and Plan: 2 and Ondansetron, Dexamethasone and Treatment may vary due to age or medical condition  Airway Management Planned: Oral ETT  Additional Equipment:   Intra-op Plan:   Post-operative Plan: Extubation in OR  Informed Consent: I have reviewed the patients History and Physical, chart, labs and discussed the procedure including the risks, benefits and alternatives for the proposed anesthesia with the patient or authorized representative who has indicated his/her understanding and acceptance.   Dental advisory given  Plan Discussed with: CRNA and Surgeon  Anesthesia Plan Comments:         Anesthesia Quick Evaluation

## 2018-10-03 NOTE — Interval H&P Note (Signed)
History and Physical Interval Note:  10/03/2018 10:45 AM  Shawn Ellison  has presented today for surgery, with the diagnosis of PROSTATE CANCER  The various methods of treatment have been discussed with the patient and family. After consideration of risks, benefits and other options for treatment, the patient has consented to  Procedure(s): XI ROBOTIC ASSISTED LAPAROSCOPIC RADICAL PROSTATECTOMY LEVEL 2 (N/A) LYMPHADENECTOMY, PELVIC (Bilateral) as a surgical intervention .  The patient's history has been reviewed, patient examined, no change in status, stable for surgery.  I have reviewed the patient's chart and labs.  Questions were answered to the patient's satisfaction.     [employer]

## 2018-10-03 NOTE — Op Note (Signed)
Preoperative diagnosis: Clinically localized adenocarcinoma of the prostate (clinical stage T2 N0 M0)  Postoperative diagnosis: Clinically localized adenocarcinoma of the prostate (clinical stage T2 N0 M0)  Procedure:  1. Robotic assisted laparoscopic radical prostatectomy (bilateral nerve sparing) 2. Bilateral robotic assisted laparoscopic pelvic lymphadenectomy  Surgeon: Pryor Curia. M.D.  Assistant: Debbrah Alar, PA-C  An assistant was required for this surgical procedure.  The duties of the assistant included but were not limited to suctioning, passing suture, camera manipulation, retraction. This procedure would not be able to be performed without an Environmental consultant.  Resident: Dr. Lewie Loron  Anesthesia: General  Complications: None  EBL: 100 mL  IVF:  1400 mL crystalloid  Specimens: 1. Prostate and seminal vesicles 2. Right pelvic lymph nodes 3. Left pelvic lymph nodes  Disposition of specimens: Pathology  Drains: 1. 20 Fr coude catheter 2. # 19 Blake pelvic drain  Indication: Shawn Ellison is a 63 y.o. year old patient with clinically localized prostate cancer.  After a thorough review of the management options for treatment of prostate cancer, he elected to proceed with surgical therapy and the above procedure(s).  We have discussed the potential benefits and risks of the procedure, side effects of the proposed treatment, the likelihood of the patient achieving the goals of the procedure, and any potential problems that might occur during the procedure or recuperation. Informed consent has been obtained.  Description of procedure:  The patient was taken to the operating room and a general anesthetic was administered. He was given preoperative antibiotics, placed in the dorsal lithotomy position, and prepped and draped in the usual sterile fashion. Next a preoperative timeout was performed. A urethral catheter was placed into the bladder and a site was selected near  the umbilicus for placement of the camera port. This was placed using a standard open Hassan technique which allowed entry into the peritoneal cavity under direct vision and without difficulty. An 8 mm robotic port was placed and a pneumoperitoneum established. The camera was then used to inspect the abdomen and there was no evidence of any intra-abdominal injuries or other abnormalities. The remaining abdominal ports were then placed. 8 mm robotic ports were placed in the right lower quadrant, left lower quadrant, and far left lateral abdominal wall. A 5 mm port was placed in the right upper quadrant and a 12 mm port was placed in the right lateral abdominal wall for laparoscopic assistance. All ports were placed under direct vision without difficulty. The surgical cart was then docked.   Utilizing the cautery scissors, the bladder was reflected posteriorly allowing entry into the space of Retzius and identification of the endopelvic fascia and prostate. The periprostatic fat was then removed from the prostate allowing full exposure of the endopelvic fascia. The endopelvic fascia was then incised from the apex back to the base of the prostate bilaterally and the underlying levator muscle fibers were swept laterally off the prostate thereby isolating the dorsal venous complex. The dorsal vein was then stapled and divided with a 45 mm Flex Echelon stapler. Attention then turned to the bladder neck which was divided anteriorly thereby allowing entry into the bladder and exposure of the urethral catheter. The catheter balloon was deflated and the catheter was brought into the operative field and used to retract the prostate anteriorly. The posterior bladder neck was then examined and was divided allowing further dissection between the bladder and prostate posteriorly until the vasa deferentia and seminal vessels were identified. The vasa deferentia were isolated,  divided, and lifted anteriorly. The seminal vesicles  were dissected down to their tips with care to control the seminal vascular arterial blood supply. These structures were then lifted anteriorly and the space between Denonvillier's fascia and the anterior rectum was developed with a combination of sharp and blunt dissection. This isolated the vascular pedicles of the prostate.  The lateral prostatic fascia was then sharply incised allowing release of the neurovascular bundles bilaterally. The vascular pedicles of the prostate were then ligated with Weck clips between the prostate and neurovascular bundles and divided with sharp cold scissor dissection resulting in neurovascular bundle preservation. On the left side, care was taken to leave a small amount of periprostatic fat with the prostate resulting in partial nerve sparing.The neurovascular bundles were then separated off the apex of the prostate and urethra bilaterally.  The urethra was then sharply transected allowing the prostate specimen to be disarticulated. The pelvis was copiously irrigated and hemostasis was ensured. There was no evidence for rectal injury.  Attention then turned to the right pelvic sidewall. The fibrofatty tissue between the external iliac vein, confluence of the iliac vessels, hypogastric artery, and Cooper's ligament was dissected free from the pelvic sidewall with care to preserve the obturator nerve. Weck clips were used for lymphostasis and hemostasis. An identical procedure was performed on the contralateral side and the lymphatic packets were removed for permanent pathologic analysis.  Attention then turned to the urethral anastomosis. A 2-0 Vicryl slip knot was placed between Denonvillier's fascia, the posterior bladder neck, and the posterior urethra to reapproximate these structures. A double-armed 3-0 Monocryl suture was then used to perform a 360 running tension-free anastomosis between the bladder neck and urethra. A new urethral catheter was then placed into the  bladder and irrigated. There were no blood clots within the bladder and the anastomosis appeared to be watertight. A #19 Blake drain was then brought through the left lateral 8 mm port site and positioned appropriately within the pelvis. It was secured to the skin with a nylon suture. The surgical cart was then undocked. The right lateral 12 mm port site was closed at the fascial level with a 0 Vicryl suture placed laparoscopically. All remaining ports were then removed under direct vision. The prostate specimen was removed intact within the Endopouch retrieval bag via the periumbilical camera port site. This fascial opening was closed with two running 0 Vicryl sutures. 0.25% Marcaine was then injected into all port sites and all incisions were reapproximated at the skin level with 4-0 Monocryl subcuticular sutures and Dermabond. The patient appeared to tolerate the procedure well and without complications. The patient was able to be extubated and transferred to the recovery unit in satisfactory condition.   Pryor Curia MD

## 2018-10-03 NOTE — Discharge Instructions (Signed)

## 2018-10-03 NOTE — Transfer of Care (Signed)
Immediate Anesthesia Transfer of Care Note  Patient: Shawn Ellison  Procedure(s) Performed: XI ROBOTIC ASSISTED LAPAROSCOPIC RADICAL PROSTATECTOMY LEVEL 2 (N/A ) LYMPHADENECTOMY, PELVIC (Bilateral )  Patient Location: PACU  Anesthesia Type:General  Level of Consciousness: sedated  Airway & Oxygen Therapy: Patient Spontanous Breathing and Patient connected to face mask oxygen  Post-op Assessment: Report given to RN and Post -op Vital signs reviewed and stable  Post vital signs: Reviewed and stable  Last Vitals:  Vitals Value Taken Time  BP 166/91 10/03/2018  3:25 PM  Temp    Pulse    Resp    SpO2    Vitals shown include unvalidated device data.  Last Pain:  Vitals:   10/03/18 0932  TempSrc: Oral         Complications: No apparent anesthesia complications

## 2018-10-03 NOTE — Progress Notes (Addendum)
Patient ID: Shawn Ellison, male   DOB: 10/30/1955, 63 y.o.   MRN: [id number]  Post-op note  Subjective: The patient is doing well.  No complaints except for right eye irritation.  Objective: Vital signs in last 24 hours: Temp:  [97.6 F (36.4 C)-97.8 F (36.6 C)] 97.6 F (36.4 C) (01/06 1641) Pulse Rate:  [74-92] 74 (01/06 1641) Resp:  [12-22] 14 (01/06 1641) BP: (159-168)/(91-106) 161/96 (01/06 1641) SpO2:  [98 %-100 %] 98 % (01/06 1641)  Intake/Output from previous day: No intake/output data recorded. Intake/Output this shift: Total I/O In: 1615 [I.V.:1415; IV Piggyback:200] Out: 230 [Urine:100; Drains:30; Blood:100]  Physical Exam:  General: Alert and oriented. Abdomen: Soft, Nondistended. Incisions: Clean and dry. GU: Urine is clear.  Lab Results: Recent Labs    10/03/18 1544  HGB 15.2  HCT 44.9    Assessment/Plan: [name], ambulate, IS 2) Saline irrigation and artifical tears prn   Pryor Curia. MD   LOS: 0 days   Dutch Gray 10/03/2018, 5:15 PM

## 2018-10-04 ENCOUNTER — Encounter (HOSPITAL_COMMUNITY): Payer: Self-pay | Admitting: Urology

## 2018-10-04 DIAGNOSIS — C61 Malignant neoplasm of prostate: Secondary | ICD-10-CM | POA: Diagnosis not present

## 2018-10-04 LAB — HEMOGLOBIN AND HEMATOCRIT, BLOOD
HCT: 39.8 % (ref 39.0–52.0)
Hemoglobin: 13.6 g/dL (ref 13.0–17.0)

## 2018-10-04 MED ORDER — BISACODYL 10 MG RE SUPP
10.0000 mg | Freq: Once | RECTAL | Status: AC
  Administered 2018-10-04: 10 mg via RECTAL
  Filled 2018-10-04: qty 1

## 2018-10-04 MED ORDER — TRAMADOL HCL 50 MG PO TABS: 50.0000 mg | ORAL_TABLET | Freq: Four times a day (QID) | ORAL | Status: DC | PRN

## 2018-10-04 NOTE — Progress Notes (Signed)
Patient ID: Shawn Ellison, male   DOB: [birth year], 63 y.o.   MRN: [id number]  1 Day Post-Op Subjective: The patient is doing well.  No nausea or vomiting. Pain is adequately controlled.  Objective: Vital signs in last 24 hours: Temp:  [97.6 F (36.4 C)-98 F (36.7 C)] 97.6 F (36.4 C) (01/07 0529) Pulse Rate:  [65-92] 73 (01/07 0529) Resp:  [12-22] 19 (01/07 0529) BP: (135-168)/(74-106) 136/78 (01/07 0529) SpO2:  [95 %-100 %] 95 % (01/07 0529) Weight:  [89.4 kg] 89.4 kg (01/06 1722)  Intake/Output from previous day: 01/06 0701 - 01/07 0700 In: 3326 [I.V.:2726; IV Piggyback:600] Out: 2205 [Urine:2000; Drains:105; Blood:100] Intake/Output this shift: No intake/output data recorded.  Physical Exam:  General: Alert and oriented. CV: RRR Lungs: Clear bilaterally. GI: Soft, Nondistended. Incisions: Clean, dry, and intact Urine: Clear Extremities: Nontender, no erythema, no edema.  Lab Results: Recent Labs    10/03/18 1544 10/04/18 0542  HGB 15.2 13.6  HCT 44.9 39.8      Assessment/Plan: POD# 1 s/p robotic prostatectomy.  1) SL IVF 2) Ambulate, Incentive spirometry 3) Transition to oral pain medication 4) Dulcolax suppository 5) D/C pelvic drain 6) Plan for likely discharge later today   Shawn Ellison. MD   LOS: 0 days   Shawn Ellison 10/04/2018, 7:24 AM

## 2018-10-04 NOTE — Discharge Summary (Signed)
  Date of admission: 10/03/2018  Date of discharge: 10/04/2018  Admission diagnosis: Prostate Cancer  Discharge diagnosis: Prostate Cancer  History and Physical: For full details, please see admission history and physical. Briefly, Shawn Ellison is a 63 y.o. gentleman with localized prostate cancer.  After discussing management/treatment options, he elected to proceed with surgical treatment.  Hospital Course: Shawn Ellison was taken to the operating room on 10/03/2018 and underwent a robotic assisted laparoscopic radical prostatectomy. He tolerated this procedure well and without complications. Postoperatively, he was able to be transferred to a regular hospital room following recovery from anesthesia.  He was able to begin ambulating the night of surgery. He remained hemodynamically stable overnight.  He had excellent urine output with appropriately minimal output from his pelvic drain and his pelvic drain was removed on POD #1.  He was transitioned to oral pain medication, tolerated a clear liquid diet, and had met all discharge criteria and was able to be discharged home later on POD#1.  Laboratory values:  Recent Labs    10/03/18 1544 10/04/18 0542  HGB 15.2 13.6  HCT 44.9 39.8    Disposition: Home  Discharge instruction: He was instructed to be ambulatory but to refrain from heavy lifting, strenuous activity, or driving. He was instructed on urethral catheter care.  Discharge medications:   Allergies as of 10/04/2018      Reactions   Lisinopril    cough      Medication List    STOP taking these medications   aspirin 81 MG tablet     TAKE these medications   atorvastatin 10 MG tablet Commonly known as:  LIPITOR Take 10 mg by mouth daily.   losartan 50 MG tablet Commonly known as:  COZAAR Take 50 mg by mouth daily.   sulfamethoxazole-trimethoprim 800-160 MG tablet Commonly known as:  BACTRIM DS,SEPTRA DS Take 1 tablet by mouth 2 (two) times daily. Start the day prior to  foley removal appointment   traMADol 50 MG tablet Commonly known as:  ULTRAM Take 1-2 tablets (50-100 mg total) by mouth every 6 (six) hours as needed for moderate pain or severe pain.       Followup: He will followup in 1 week for catheter removal and to discuss his surgical pathology results.

## 2019-02-25 IMAGING — NM NM BONE WHOLE BODY
2 series · 2 of 2 positions shown · non-contrast
Comparison: None.

CLINICAL DATA: Prostate carcinoma, currently asymptomatic

EXAM:
NUCLEAR MEDICINE WHOLE BODY BONE SCAN
TECHNIQUE: Whole body anterior and posterior images were obtained approximately
3 hours after intravenous injection of radiopharmaceutical.
RADIOPHARMACEUTICALS:  20.6 mCi Rechnetium-OOm MDP IV

[Series 1: whole body · 2.66mm/px · 1 of 1 slices shown (1 of 2)]
[im 1/1]
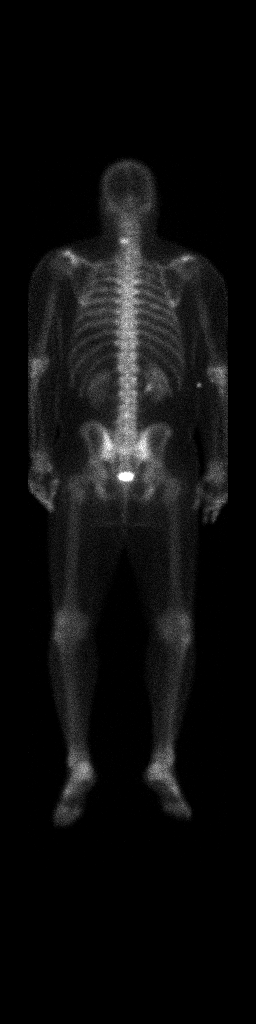

[Series 1: whole body · 2.66mm/px · 1 of 1 slices shown (2 of 2)]
[im 1/1]
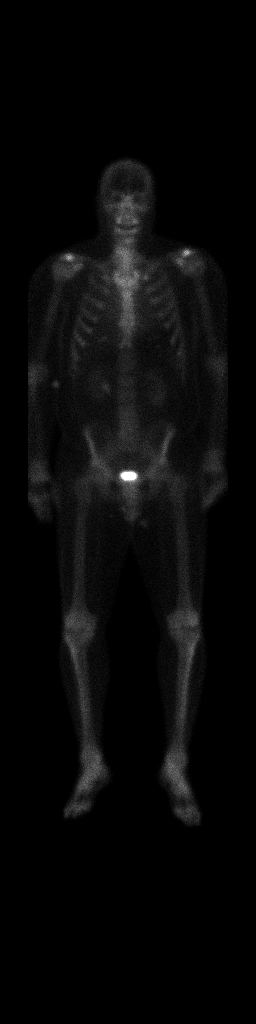

[2 of 2 positions shown; findings below may reference images not displayed]

FINDINGS: Focus of increased activity to the left of midline in the posterior
lower cervical spine characteristic of degenerative change.
Otherwise physiologic distribution of radiopharmaceutical.
IMPRESSION: No evidence of osseous metastatic disease.

## 2019-12-03 ENCOUNTER — Ambulatory Visit: Payer: BLUE CROSS/BLUE SHIELD

## 2022-04-20 ENCOUNTER — Ambulatory Visit: Payer: BLUE CROSS/BLUE SHIELD | Admitting: Dermatology

## 2022-07-16 ENCOUNTER — Ambulatory Visit: Payer: BC Managed Care – PPO | Admitting: Dermatology

## 2022-07-16 DIAGNOSIS — Z1283 Encounter for screening for malignant neoplasm of skin: Secondary | ICD-10-CM | POA: Diagnosis not present

## 2022-07-16 DIAGNOSIS — L814 Other melanin hyperpigmentation: Secondary | ICD-10-CM

## 2022-07-16 DIAGNOSIS — L821 Other seborrheic keratosis: Secondary | ICD-10-CM

## 2022-07-16 DIAGNOSIS — Z808 Family history of malignant neoplasm of other organs or systems: Secondary | ICD-10-CM

## 2022-07-16 DIAGNOSIS — L578 Other skin changes due to chronic exposure to nonionizing radiation: Secondary | ICD-10-CM | POA: Diagnosis not present

## 2022-07-16 DIAGNOSIS — D229 Melanocytic nevi, unspecified: Secondary | ICD-10-CM

## 2022-07-16 DIAGNOSIS — D489 Neoplasm of uncertain behavior, unspecified: Secondary | ICD-10-CM

## 2022-07-16 DIAGNOSIS — D485 Neoplasm of uncertain behavior of skin: Secondary | ICD-10-CM | POA: Diagnosis not present

## 2022-07-16 NOTE — Progress Notes (Signed)
New Patient Visit  Subjective  Shawn Ellison is a 66 y.o. male who presents for the following: New Patient (Initial Visit) (Patient reports history of skin cancer in mother unsure of type. Skin tag under arm right that is irritated. ). The patient presents for Total-Body Skin Exam (TBSE) for skin cancer screening and mole check.  The patient has spots, moles and lesions to be evaluated, some may be new or changing and the patient has concerns that these could be cancer.  The following portions of the chart were reviewed this encounter and updated as appropriate:   Tobacco  Allergies  Meds  Problems  Med Hx  Surg Hx  Fam Hx     Review of Systems:  No other skin or systemic complaints except as noted in HPI or Assessment and Plan.  Objective  Well appearing patient in no apparent distress; mood and affect are within normal limits.  A full examination was performed including scalp, head, eyes, ears, nose, lips, neck, chest, axillae, abdomen, back, buttocks, bilateral upper extremities, bilateral lower extremities, hands, feet, fingers, toes, fingernails, and toenails. All findings within normal limits unless otherwise noted below.  Right Axilla 0.6 cm flesh colored papule    Assessment & Plan  Neoplasm of uncertain behavior Right Axilla Epidermal / dermal shaving  Lesion diameter (cm):  0.6 Informed consent: discussed and consent obtained   Timeout: patient name, date of birth, surgical site, and procedure verified   Procedure prep:  Patient was prepped and draped in usual sterile fashion Prep type:  Isopropyl alcohol Anesthesia: the lesion was anesthetized in a standard fashion   Anesthetic:  1% lidocaine w/ epinephrine 1-100,000 buffered w/ 8.4% NaHCO3 Instrument used: flexible razor blade   Hemostasis achieved with: pressure, aluminum chloride and electrodesiccation   Outcome: patient tolerated procedure well   Post-procedure details: sterile dressing applied and wound  care instructions given   Dressing type: bandage and petrolatum    Specimen 1 - Surgical pathology Differential Diagnosis: Irritated nevus r/o dysplasia vs skin tag Check Margins: No Irritated nevus r/o dysplasia vs skin tag  Lentigines - Scattered tan macules - Due to sun exposure - Benign-appearing, observe - Recommend daily broad spectrum sunscreen SPF 30+ to sun-exposed areas, reapply every 2 hours as needed. - Call for any changes  Seborrheic Keratoses - Stuck-on, waxy, tan-brown papules and/or plaques  - Benign-appearing - Discussed benign etiology and prognosis. - Observe - Call for any changes  Melanocytic Nevi Behind right ear - Tan-brown and/or pink-flesh-colored symmetric macules and papules - Benign appearing on exam today - Observation - Call clinic for new or changing moles - Recommend daily use of broad spectrum spf 30+ sunscreen to sun-exposed areas.   Hemangiomas - Red papules - Discussed benign nature - Observe - Call for any changes  Actinic Damage - Chronic condition, secondary to cumulative UV/sun exposure - diffuse scaly erythematous macules with underlying dyspigmentation - Recommend daily broad spectrum sunscreen SPF 30+ to sun-exposed areas, reapply every 2 hours as needed.  - Staying in the shade or wearing long sleeves, sun glasses (UVA+UVB protection) and wide brim hats (4-inch brim around the entire circumference of the hat) are also recommended for sun protection.  - Call for new or changing lesions.  Family history of skin cancer - what type(s): unsure of type - who affected: mother   Skin cancer screening performed today. Return in about 1 year (around 07/17/2023) for TBSE.  I, Ruthell Rummage, CMA, am acting as scribe for  Sarina Ser, MD. Documentation: I have reviewed the above documentation for accuracy and completeness, and I agree with the above.  Sarina Ser, MD

## 2022-07-16 NOTE — Patient Instructions (Addendum)
Biopsy Wound Care Instructions  Leave the original bandage on for 24 hours if possible.  If the bandage becomes soaked or soiled before that time, it is OK to remove it and examine the wound.  A small amount of post-operative bleeding is normal.  If excessive bleeding occurs, remove the bandage, place gauze over the site and apply continuous pressure (no peeking) over the area for 30 minutes. If this does not work, please call our clinic as soon as possible or page your doctor if it is after hours.   Once a day, cleanse the wound with soap and water. It is fine to shower. If a thick crust develops you may use a Q-tip dipped into dilute hydrogen peroxide (mix 1:1 with water) to dissolve it.  Hydrogen peroxide can slow the healing process, so use it only as needed.    After washing, apply petroleum jelly (Vaseline) or an antibiotic ointment if your doctor prescribed one for you, followed by a bandage.    For best healing, the wound should be covered with a layer of ointment at all times. If you are not able to keep the area covered with a bandage to hold the ointment in place, this may mean re-applying the ointment several times a day.  Continue this wound care until the wound has healed and is no longer open.   Itching and mild discomfort is normal during the healing process. However, if you develop pain or severe itching, please call our office.   If you have any discomfort, you can take Tylenol (acetaminophen) or ibuprofen as directed on the bottle. (Please do not take these if you have an allergy to them or cannot take them for another reason).  Some redness, tenderness and white or yellow material in the wound is normal healing.  If the area becomes very sore and red, or develops a thick yellow-green material (pus), it may be infected; please notify us.    If you have stitches, return to clinic as directed to have the stitches removed. You will continue wound care for 2-3 days after the stitches  are removed.   Wound healing continues for up to one year following surgery. It is not unusual to experience pain in the scar from time to time during the interval.  If the pain becomes severe or the scar thickens, you should notify the office.    A slight amount of redness in a scar is expected for the first six months.  After six months, the redness will fade and the scar will soften and fade.  The color difference becomes less noticeable with time.  If there are any problems, return for a post-op surgery check at your earliest convenience.  To improve the appearance of the scar, you can use silicone scar gel, cream, or sheets (such as Mederma or Serica) every night for up to one year. These are available over the counter (without a prescription).  Please call our office at [phone] for any questions or concerns.      Seborrheic Keratosis  What causes seborrheic keratoses? Seborrheic keratoses are harmless, common skin growths that first appear during adult life.  As time goes by, more growths appear.  Some people may develop a large number of them.  Seborrheic keratoses appear on both covered and uncovered body parts.  They are not caused by sunlight.  The tendency to develop seborrheic keratoses can be inherited.  They vary in color from skin-colored to gray, brown, or even black.    They can be either smooth or have a rough, warty surface.   Seborrheic keratoses are superficial and look as if they were stuck on the skin.  Under the microscope this type of keratosis looks like layers upon layers of skin.  That is why at times the top layer may seem to fall off, but the rest of the growth remains and re-grows.    Treatment Seborrheic keratoses do not need to be treated, but can easily be removed in the office.  Seborrheic keratoses often cause symptoms when they rub on clothing or jewelry.  Lesions can be in the way of shaving.  If they become inflamed, they can cause itching, soreness,  or burning.  Removal of a seborrheic keratosis can be accomplished by freezing, burning, or surgery. If any spot bleeds, scabs, or grows rapidly, please return to have it checked, as these can be an indication of a skin cancer.    Melanoma ABCDEs  Melanoma is the most dangerous type of skin cancer, and is the leading cause of death from skin disease.  You are more likely to develop melanoma if you: Have light-colored skin, light-colored eyes, or red or blond hair Spend a lot of time in the sun Tan regularly, either outdoors or in a tanning bed Have had blistering sunburns, especially during childhood Have a close family member who has had a melanoma Have atypical moles or large birthmarks  Early detection of melanoma is key since treatment is typically straightforward and cure rates are extremely high if we catch it early.   The first sign of melanoma is often a change in a mole or a new dark spot.  The ABCDE system is a way of remembering the signs of melanoma.  A for asymmetry:  The two halves do not match. B for border:  The edges of the growth are irregular. C for color:  A mixture of colors are present instead of an even brown color. D for diameter:  Melanomas are usually (but not always) greater than 6mm - the size of a pencil eraser. E for evolution:  The spot keeps changing in size, shape, and color.  Please check your skin once per month between visits. You can use a small mirror in front and a large mirror behind you to keep an eye on the back side or your body.   If you see any new or changing lesions before your next follow-up, please call to schedule a visit.  Please continue daily skin protection including broad spectrum sunscreen SPF 30+ to sun-exposed areas, reapplying every 2 hours as needed when you're outdoors.   Staying in the shade or wearing long sleeves, sun glasses (UVA+UVB protection) and wide brim hats (4-inch brim around the entire circumference of the hat)  are also recommended for sun protection.    Due to recent changes in healthcare laws, you may see results of your pathology and/or laboratory studies on MyChart before the doctors have had a chance to review them. We understand that in some cases there may be results that are confusing or concerning to you. Please understand that not all results are received at the same time and often the doctors may need to interpret multiple results in order to provide you with the best plan of care or course of treatment. Therefore, we ask that you please give us 2 business days to thoroughly review all your results before contacting the office for clarification. Should we see a critical lab result, you will   be contacted sooner.   If You Need Anything After Your Visit  If you have any questions or concerns for your doctor, please call our main line at [phone] and press option 4 to reach your doctor's medical assistant. If no one answers, please leave a voicemail as directed and we will return your call as soon as possible. Messages left after 4 pm will be answered the following business day.   You may also send us a message via MyChart. We typically respond to MyChart messages within 1-2 business days.  For prescription refills, please ask your pharmacy to contact our office. Our fax number is [phone].  If you have an urgent issue when the clinic is closed that cannot wait until the next business day, you can page your doctor at the number below.    Please note that while we do our best to be available for urgent issues outside of office hours, we are not available 24/7.   If you have an urgent issue and are unable to reach us, you may choose to seek medical care at your doctor's office, retail clinic, urgent care center, or emergency room.  If you have a medical emergency, please immediately call 911 or go to the emergency department.  Pager Numbers  - Dr. Kowalski: [phone]  - Dr. Moye:  [phone]  - Dr. Stewart: [phone]  In the event of inclement weather, please call our main line at [phone] for an update on the status of any delays or closures.  Dermatology Medication Tips: Please keep the boxes that topical medications come in in order to help keep track of the instructions about where and how to use these. Pharmacies typically print the medication instructions only on the boxes and not directly on the medication tubes.   If your medication is too expensive, please contact our office at [phone] option 4 or send us a message through MyChart.   We are unable to tell what your co-pay for medications will be in advance as this is different depending on your insurance coverage. However, we may be able to find a substitute medication at lower cost or fill out paperwork to get insurance to cover a needed medication.   If a prior authorization is required to get your medication covered by your insurance company, please allow us 1-2 business days to complete this process.  Drug prices often vary depending on where the prescription is filled and some pharmacies may offer cheaper prices.  The website www.goodrx.com contains coupons for medications through different pharmacies. The prices here do not account for what the cost may be with help from insurance (it may be cheaper with your insurance), but the website can give you the price if you did not use any insurance.  - You can print the associated coupon and take it with your prescription to the pharmacy.  - You may also stop by our office during regular business hours and pick up a GoodRx coupon card.  - If you need your prescription sent electronically to a different pharmacy, notify our office through [facility] MyChart or by phone at [phone] option 4.     Si Usted Necesita Algo Despus de Su Visita  Tambin puede enviarnos un mensaje a travs de MyChart. Por lo general respondemos a los mensajes de  MyChart en el transcurso de 1 a 2 das hbiles.  Para renovar recetas, por favor pida a su farmacia que se ponga en contacto con nuestra oficina. Nuestro nmero de fax   es el [phone].  Si tiene un asunto urgente cuando la clnica est cerrada y que no puede esperar hasta el siguiente da hbil, puede llamar/localizar a su doctor(a) al nmero que aparece a continuacin.   Por favor, tenga en cuenta que aunque hacemos todo lo posible para estar disponibles para asuntos urgentes fuera del horario de oficina, no estamos disponibles las 24 horas del da, los 7 das de la semana.   Si tiene un problema urgente y no puede comunicarse con nosotros, puede optar por buscar atencin mdica  en el consultorio de su doctor(a), en una clnica privada, en un centro de atencin urgente o en una sala de emergencias.  Si tiene una emergencia mdica, por favor llame inmediatamente al 911 o vaya a la sala de emergencias.  Nmeros de bper  - Dr. Kowalski: [phone]  - Dra. Moye: [phone]  - Dra. Stewart: [phone]  En caso de inclemencias del tiempo, por favor llame a nuestra lnea principal al [phone] para una actualizacin sobre el estado de cualquier retraso o cierre.  Consejos para la medicacin en dermatologa: Por favor, guarde las cajas en las que vienen los medicamentos de uso tpico para ayudarle a seguir las instrucciones sobre dnde y cmo usarlos. Las farmacias generalmente imprimen las instrucciones del medicamento slo en las cajas y no directamente en los tubos del medicamento.   Si su medicamento es muy caro, por favor, pngase en contacto con nuestra oficina llamando al [phone] y presione la opcin 4 o envenos un mensaje a travs de MyChart.   No podemos decirle cul ser su copago por los medicamentos por adelantado ya que esto es diferente dependiendo de la cobertura de su seguro. Sin embargo, es posible que podamos encontrar un medicamento sustituto a menor costo o  llenar un formulario para que el seguro cubra el medicamento que se considera necesario.   Si se requiere una autorizacin previa para que su compaa de seguros cubra su medicamento, por favor permtanos de 1 a 2 das hbiles para completar este proceso.  Los precios de los medicamentos varan con frecuencia dependiendo del lugar de dnde se surte la receta y alguna farmacias pueden ofrecer precios ms baratos.  El sitio web www.goodrx.com tiene cupones para medicamentos de diferentes farmacias. Los precios aqu no tienen en cuenta lo que podra costar con la ayuda del seguro (puede ser ms barato con su seguro), pero el sitio web puede darle el precio si no utiliz ningn seguro.  - Puede imprimir el cupn correspondiente y llevarlo con su receta a la farmacia.  - Tambin puede pasar por nuestra oficina durante el horario de atencin regular y recoger una tarjeta de cupones de GoodRx.  - Si necesita que su receta se enve electrnicamente a una farmacia diferente, informe a nuestra oficina a travs de MyChart de [facility] o por telfono llamando al [phone] y presione la opcin 4.  

## 2022-07-22 ENCOUNTER — Telehealth: Payer: Self-pay

## 2022-07-22 NOTE — Telephone Encounter (Signed)
-----   Message from Ralene Bathe, MD sent at 07/21/2022  6:28 PM EDT ----- Diagnosis Skin , right axilla [place]  Benign skin tag No further treatment needed

## 2022-07-22 NOTE — Telephone Encounter (Signed)
Advised patient of results/hd  

## 2022-07-26 ENCOUNTER — Encounter: Payer: Self-pay | Admitting: Dermatology

## 2023-01-27 ENCOUNTER — Ambulatory Visit (HOSPITAL_BASED_OUTPATIENT_CLINIC_OR_DEPARTMENT_OTHER): Payer: BC Managed Care – PPO | Admitting: Orthopaedic Surgery

## 2023-01-27 ENCOUNTER — Encounter (HOSPITAL_BASED_OUTPATIENT_CLINIC_OR_DEPARTMENT_OTHER): Payer: Self-pay | Admitting: Orthopaedic Surgery

## 2023-01-27 DIAGNOSIS — M1611 Unilateral primary osteoarthritis, right hip: Secondary | ICD-10-CM | POA: Diagnosis not present

## 2023-01-27 NOTE — Progress Notes (Signed)
Chief Complaint: Right hip pain     History of Present Illness:    Shawn Ellison is a 67 y.o. male presents today with ongoing right hip pain which has been persistent for the last several years.  He has previously been seen by Dr. Sherilyn Dacosta recommended an x-ray guided injection of the right hip.  This did give him extremely good relief from 1 and half weeks.  This is subsequently worn off.  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  He has previously had physical therapy for multiple months.  His son is a physical therapist as well although he is somewhat plateaued in response to this.  He is having persistent pain with most activities.  He is having limited range of motion.  He has had an MRI at Dr. Guilford Shi office.  This was available in the PACS.  He has been on meloxicam as well as Celebrex although is subsequently switched back to meloxicam as this is helping the most.  He does state he has a history of sciatica which did resolve after injection.    Surgical History:   None  PMH/PSH/Family History/Social History/Meds/Allergies:    Past Medical History:  Diagnosis Date   Hyperlipidemia    Hypertension    Sciatica    right L5 distribution- [facility] 2012   Past Surgical History:  Procedure Laterality Date   COLONOSCOPY  03/25/2007   INTERNAL HEMORRHOIDS PER Dr. Glory Buff SURGERY  2002   LAPAROSCOPIC CHOLECYCSTECTOMY  10/30/2007   dR. bYRNETT   LYMPHADENECTOMY Bilateral 10/03/2018   Procedure: LYMPHADENECTOMY, PELVIC;  Surgeon: Heloise Purpura, MD;  Location: [facility];  Service: Urology;  Laterality: Bilateral;   PROSTATE BIOPSY  06/2018   right inguinal hernia   25 years ago   with mesh    RIH     ROBOT ASSISTED LAPAROSCOPIC RADICAL PROSTATECTOMY N/A 10/03/2018   Procedure: XI ROBOTIC ASSISTED LAPAROSCOPIC RADICAL PROSTATECTOMY LEVEL 2;  Surgeon: Heloise Purpura, MD;  Location: [facility];   Service: Urology;  Laterality: N/A;   Social History   Socioeconomic History   Marital status: Married    Spouse name: Not on file   Number of children: Not on file   Years of education: Not on file   Highest education level: Not on file  Occupational History   Not on file  Tobacco Use   Smoking status: Never   Smokeless tobacco: Never  Substance and Sexual Activity   Alcohol use: Yes    Comment: occ   Drug use: No   Sexual activity: Not on file  Other Topics Concern   Not on file  Social History Narrative   Occupation: Holiday representative of plants for [employer] (frequent travel to [place])   Married 2005- second marriage.  2 Children   From [place], in [place] 30+year.   Enjoys Sports coach, yardwork   no tob   alcohol: 6 pack/week         Social Determinants of Corporate investment banker Strain: Not on file  Food Insecurity: Not on file  Transportation Needs: Not on file  Physical Activity: Not on file  Stress: Not on file  Social Connections: Not on file   Family History  Problem Relation Age of Onset  Hypothyroidism Mother    Hypertension Father    Cancer Father        AML   Hypertension Son    Hypertension Son    Hypertension Brother    Hypertension Maternal Grandfather    Diabetes Maternal Grandfather    Colon cancer Maternal Aunt    Prostate cancer Neg Hx    Allergies  Allergen Reactions   Lisinopril     cough   Current Outpatient Medications  Medication Sig Dispense Refill   atorvastatin (LIPITOR) 10 MG tablet Take 10 mg by mouth daily.      losartan (COZAAR) 50 MG tablet Take 50 mg by mouth daily.      meloxicam (MOBIC) 15 MG tablet Take 15 mg by mouth daily.     No current facility-administered medications for this visit.   No results found.  Review of Systems:   A ROS was performed including pertinent positives and negatives as documented in the HPI.  Physical Exam :   Constitutional: NAD and appears stated age Neurological: Alert  and oriented Psych: Appropriate affect and cooperative There were no vitals taken for this visit.   Comprehensive Musculoskeletal Exam:    Inspection Right Left  Skin No atrophy or gross abnormalities appreciated No atrophy or gross abnormalities appreciated  Palpation    Tenderness Femoral acetabular None  Crepitus None None  Range of Motion    Flexion (passive) 120 120  Extension 30 30  IR 20 with pain 35  ER 50 50  Strength    Flexion  5/5 5/5  Extension 5/5 5/5  Special Tests    FABER Negative Negative  FADIR Negative Negative  ER Lag/Capsular Insufficiency Negative Negative  Instability Negative Negative  Sacroiliac pain Negative  Negative   Instability    Generalized Laxity No No  Neurologic    sciatic, femoral, obturator nerves intact to light sensation  Vascular/Lymphatic    DP pulse 2+ 2+  Lumbar Exam    Patient has symmetric lumbar range of motion with negative pain referral to hip     Imaging:   Xray (AP pelvis, right hip, left hip 1 view): Tonus grade 2-3 changes on the right  MRI (right hip): Diffuse chondral wearing as well as cystic changes involving the femoral head consistent with more tonus grade 3 arthritic changes.  There is a large labral tear  I personally reviewed and interpreted the radiographs.   Assessment:   67 y.o. 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  I did have a long discussion with him today.  He did get extremely good relief from an injection which for diagnostic purposes I do believe confirms his hip joint as a source of pain.  I did discuss that his MRI findings are quite significant and more reflective of significant chondral wear.  To this effect I do not really believe that he would get long-term relief from hip arthroscopy or preservation.  I do believe he would be an excellent candidate for anterior approach hip replacement.  He does have a history of prostate cancer which is being treated and his PSA  level is nondetectable.  He does have a wife who is currently undergoing revision hernia surgery.  He does need to stay active with regard to this.  I have advised that he may continue Mobic in the meantime.  I will plan to make referral to Dr. Magnus Ivan for discussion of hip arthroplasty  Plan :    -Plan for referral  to Dr. Magnus Ivan for discussion of hip arthroplasty     I personally saw and evaluated the patient, and participated in the management and treatment plan.  Huel Cote, MD Attending Physician, Orthopedic Surgery  This document was dictated using Dragon voice recognition software. A reasonable attempt at proof reading has been made to minimize errors.

## 2023-02-15 ENCOUNTER — Ambulatory Visit (INDEPENDENT_AMBULATORY_CARE_PROVIDER_SITE_OTHER): Payer: BLUE CROSS/BLUE SHIELD | Admitting: Orthopaedic Surgery

## 2023-02-15 ENCOUNTER — Other Ambulatory Visit (INDEPENDENT_AMBULATORY_CARE_PROVIDER_SITE_OTHER): Payer: BLUE CROSS/BLUE SHIELD

## 2023-02-15 ENCOUNTER — Encounter: Payer: Self-pay | Admitting: Orthopaedic Surgery

## 2023-02-15 VITALS — Ht 69.0 in | Wt 197.0 lb

## 2023-02-15 DIAGNOSIS — M1611 Unilateral primary osteoarthritis, right hip: Secondary | ICD-10-CM | POA: Insufficient documentation

## 2023-02-15 DIAGNOSIS — M25551 Pain in right hip: Secondary | ICD-10-CM

## 2023-02-15 NOTE — Progress Notes (Signed)
The patient is a 67 year old active gentleman that I am seeing for the first time.  He has been dealing with right hip and low back pain with radicular symptoms for several years now.  Eventually a MRI was obtained of his right hip in February of this year and it should quite significant arthritis of the right hip but also degenerative labral tearing and paralabral cystic changes.  He does have right hip stiffness and groin pain.  Is been slowly getting worse.  He does take Celebrex twice daily and that has not helped him much at all.  His lumbar spine has been extensively worked up before as well.  He is otherwise healthy individual.  I was able to review all of his medications and past medical history within epic.  At this point his hip pain is detrimentally affecting his mobility, his quality of life and his actives daily living.  He is not a smoker and not obese.  He is not a diabetic.  His right hip is significantly stiff with internal and external rotation with pain in the groin or rotation.  His left hip exam is entirely normal.  Standing AP pelvis lateral right hip shows bone-on-bone wear of the right hip at this standpoint.  There are osteophytes around the hip joint as well as sclerotic changes and complete loss of the superior lateral joint space.  His left hip appears normal.  The MRI also confirms significant arthritis of the right hip with edema in the femoral head at the weightbearing surface and acetabulum as well as degenerative labral tearing and paralabral cystic changes.  I showed him a hip replacement model and described that our recommendation would be hip replacement for his right hip to treat his pain long-term.  We discussed the risk and benefits of the surgery and I gave him a handout about hip replacement surgery.  I went over his x-rays and MRI findings.  We discussed the risks and benefits of the surgery and what to expect from an intraoperative and postoperative course.  All  questions and concerns were answered and addressed.  He would like to consider surgery sometime around September.  We will be in touch.

## 2023-02-25 ENCOUNTER — Encounter: Payer: Self-pay | Admitting: Orthopaedic Surgery

## 2023-03-01 NOTE — Telephone Encounter (Signed)
Called patient.  Planning surgery for 05/14/23.

## 2023-03-14 ENCOUNTER — Encounter: Payer: Self-pay | Admitting: Orthopaedic Surgery

## 2023-04-14 ENCOUNTER — Encounter: Payer: Self-pay | Admitting: Orthopaedic Surgery

## 2023-04-14 ENCOUNTER — Ambulatory Visit: Payer: BC Managed Care – PPO | Admitting: Orthopaedic Surgery

## 2023-04-14 DIAGNOSIS — M1611 Unilateral primary osteoarthritis, right hip: Secondary | ICD-10-CM

## 2023-04-14 DIAGNOSIS — M25551 Pain in right hip: Secondary | ICD-10-CM

## 2023-04-14 MED ORDER — TIZANIDINE HCL 4 MG PO CAPS: 4.0000 mg | ORAL_CAPSULE | Freq: Two times a day (BID) | ORAL | 1 refills | Status: DC | PRN

## 2023-04-14 MED ORDER — GABAPENTIN 300 MG PO CAPS: 300.0000 mg | ORAL_CAPSULE | Freq: Every day | ORAL | 1 refills | Status: DC

## 2023-04-14 NOTE — Progress Notes (Signed)
The patient comes in today with good questions as a relates to his upcoming hip replacement surgery for his right hip replacement.  This is scheduled for August 16.  He has appropriate questions as it relates to how this will affect his back pain and radicular symptoms going down his leg.  He is already seen his spine specialist he feels like this is a hip issue as well.  His x-rays were reviewed again showing severe end-stage arthritis of the right hip.  On exam his right hip is stiff with internal and external rotation.  He does walk with a limp as well.  He does let us know that he is having significant pain mainly at night and getting a comfortable position is hard on him.  He is 67 years old.  We will try Neurontin 300 mg to take at bedtime as well as Zanaflex as a muscle relaxant.  Hopefully this will help take the edge off of things.  Have also recommended a cane in his opposite hand in the interim.  All question concerns were answered and addressed.  Will see him at the time of surgery on August 16.

## 2023-04-27 ENCOUNTER — Other Ambulatory Visit: Payer: Self-pay | Admitting: Orthopaedic Surgery

## 2023-04-30 NOTE — Patient Instructions (Signed)
SURGICAL WAITING ROOM VISITATION  Patients having surgery or a procedure may have no more than 2 support people in the waiting area - these visitors may rotate.    Children under the age of 67 must have an adult with them who is not the patient.  Due to an increase in RSV and influenza rates and associated hospitalizations, children ages 86 and under may not visit patients in [facility] hospitals.  If the patient needs to stay at the hospital during part of their recovery, the visitor guidelines for inpatient rooms apply. Pre-op nurse will coordinate an appropriate time for 1 support person to accompany patient in pre-op.  This support person may not rotate.    Please refer to the [facility] website for the visitor guidelines for Inpatients (after your surgery is over and you are in a regular room).       Your procedure is scheduled on: 05/14/23   Report to [facility] Main Entrance    Report to admitting at 11:45 AM   Call this number if you have problems the morning of surgery [phone]   Do not eat food :After Midnight.   After Midnight you may have the following liquids until 11:15 DAY OF SURGERY  Water Non-Citrus Juices (without pulp, NO RED-Apple, White grape, White cranberry) Black Coffee (NO MILK/CREAM OR CREAMERS, sugar ok)  Clear Tea (NO MILK/CREAM OR CREAMERS, sugar ok) regular and decaf                             Plain Jell-O (NO RED)                                           Fruit ices (not with fruit pulp, NO RED)                                     Popsicles (NO RED)                                                               Sports drinks like Gatorade (NO RED)                The day of surgery:  Drink ONE (1) Pre-Surgery Clear Ensure  at 11:15 AM the morning of surgery. Drink in one sitting. Do not sip.  This drink was given to you during your hospital  pre-op appointment visit. Nothing else to drink after completing the  Pre-Surgery Clear  Ensure      Oral Hygiene is also important to reduce your risk of infection.                                    Remember - BRUSH YOUR TEETH THE MORNING OF SURGERY WITH YOUR REGULAR TOOTHPASTE  DENTURES WILL BE REMOVED PRIOR TO SURGERY PLEASE DO NOT APPLY "Poly grip" OR ADHESIVES!!!   Do NOT smoke after Midnight   Stop all vitamins and herbal supplements 7 days before surgery.  Take these medicines the morning of surgery: Atorvastatin, Gabapentin             You may not have any metal on your body including hair pins, jewelry, and body piercing             Do not wear lotions, powders, cologne, or deodorant              Men may shave face and neck.   Do not bring valuables to the hospital. [facility] IS NOT             RESPONSIBLE   FOR VALUABLES.   Contacts, glasses, dentures or bridgework may not be worn into surgery.   Bring small overnight bag day of surgery.   DO NOT BRING YOUR HOME MEDICATIONS TO THE HOSPITAL. PHARMACY WILL DISPENSE MEDICATIONS LISTED ON YOUR MEDICATION LIST TO YOU DURING YOUR ADMISSION IN THE HOSPITAL!    Patients discharged on the day of surgery will not be allowed to drive home.  Someone NEEDS to stay with you for the first 24 hours after anesthesia.   Special Instructions: Bring a copy of your healthcare power of attorney and living will documents the day of surgery if you haven't scanned them before.              Please read over the following fact sheets you were given: IF YOU HAVE QUESTIONS ABOUT YOUR PRE-OP INSTRUCTIONS PLEASE CALL [phone] [facility]   If you received a COVID test during your pre-op visit  it is requested that you wear a mask when out in public, stay away from anyone that may not be feeling well and notify your surgeon if you develop symptoms. If you test positive for Covid or have been in contact with anyone that has tested positive in the last 10 days please notify you surgeon.      Pre-operative 5 CHG Bath Instructions    You can play a key role in reducing the risk of infection after surgery. Your skin needs to be as free of germs as possible. You can reduce the number of germs on your skin by washing with CHG (chlorhexidine gluconate) soap before surgery. CHG is an antiseptic soap that kills germs and continues to kill germs even after washing.   DO NOT use if you have an allergy to chlorhexidine/CHG or antibacterial soaps. If your skin becomes reddened or irritated, stop using the CHG and notify one of our RNs at [phone].   Please shower with the CHG soap starting 4 days before surgery using the following schedule:     Please keep in mind the following:  DO NOT shave, including legs and underarms, starting the day of your first shower.   You may shave your face at any point before/day of surgery.  Place clean sheets on your bed the day you start using CHG soap. Use a clean washcloth (not used since being washed) for each shower. DO NOT sleep with pets once you start using the CHG.   CHG Shower Instructions:  If you choose to wash your hair and private area, wash first with your normal shampoo/soap.  After you use shampoo/soap, rinse your hair and body thoroughly to remove shampoo/soap residue.  Turn the water OFF and apply about 3 tablespoons (45 ml) of CHG soap to a CLEAN washcloth.  Apply CHG soap ONLY FROM YOUR NECK DOWN TO YOUR TOES (washing for 3-5 minutes)  DO NOT use CHG soap on face, private areas, open  wounds, or sores.  Pay special attention to the area where your surgery is being performed.  If you are having back surgery, having someone wash your back for you may be helpful. Wait 2 minutes after CHG soap is applied, then you may rinse off the CHG soap.  Pat dry with a clean towel  Put on clean clothes/pajamas   If you choose to wear lotion, please use ONLY the CHG-compatible lotions on the back of this paper.     Additional instructions for the day of surgery: DO NOT APPLY any  lotions, deodorants, cologne, or perfumes.   Put on clean/comfortable clothes.  Brush your teeth.  Ask your nurse before applying any prescription medications to the skin.      CHG Compatible Lotions   Aveeno Moisturizing lotion  Cetaphil Moisturizing Cream  Cetaphil Moisturizing Lotion  Clairol Herbal Essence Moisturizing Lotion, Dry Skin  Clairol Herbal Essence Moisturizing Lotion, Extra Dry Skin  Clairol Herbal Essence Moisturizing Lotion, Normal Skin  Curel Age Defying Therapeutic Moisturizing Lotion with Alpha Hydroxy  Curel Extreme Care Body Lotion  Curel Soothing Hands Moisturizing Hand Lotion  Curel Therapeutic Moisturizing Cream, Fragrance-Free  Curel Therapeutic Moisturizing Lotion, Fragrance-Free  Curel Therapeutic Moisturizing Lotion, Original Formula  Eucerin Daily Replenishing Lotion  Eucerin Dry Skin Therapy Plus Alpha Hydroxy Crme  Eucerin Dry Skin Therapy Plus Alpha Hydroxy Lotion  Eucerin Original Crme  Eucerin Original Lotion  Eucerin Plus Crme Eucerin Plus Lotion  Eucerin TriLipid Replenishing Lotion  Keri Anti-Bacterial Hand Lotion  Keri Deep Conditioning Original Lotion Dry Skin Formula Softly Scented  Keri Deep Conditioning Original Lotion, Fragrance Free Sensitive Skin Formula  Keri Lotion Fast Absorbing Fragrance Free Sensitive Skin Formula  Keri Lotion Fast Absorbing Softly Scented Dry Skin Formula  Keri Original Lotion  Keri Skin Renewal Lotion Keri Silky Smooth Lotion  Keri Silky Smooth Sensitive Skin Lotion  Nivea Body Creamy Conditioning Oil  Nivea Body Extra Enriched Lotion  Nivea Body Original Lotion  Nivea Body Sheer Moisturizing Lotion Nivea Crme  Nivea Skin Firming Lotion  NutraDerm 30 Skin Lotion  NutraDerm Skin Lotion  NutraDerm Therapeutic Skin Cream  NutraDerm Therapeutic Skin Lotion  ProShield Protective Hand Cream  Provon moisturizing lotion Incentive Spirometer (Watch this video at home:  [email or web address])  An incentive spirometer is a tool that can help keep your lungs clear and active. This tool measures how well you are filling your lungs with each breath. Taking long deep breaths may help reverse or decrease the chance of developing breathing (pulmonary) problems (especially infection) following: A long period of time when you are unable to move or be active. BEFORE THE PROCEDURE  If the spirometer includes an indicator to show your best effort, your nurse or respiratory therapist will set it to a desired goal. If possible, sit up straight or lean slightly forward. Try not to slouch. Hold the incentive spirometer in an upright position. INSTRUCTIONS FOR USE  Sit on the edge of your bed if possible, or sit up as far as you can in bed or on a chair. Hold the incentive spirometer in an upright position. Breathe out normally. Place the mouthpiece in your mouth and seal your lips tightly around it. Breathe in slowly and as deeply as possible, raising the piston or the ball toward the top of the column. Hold your breath for 3-5 seconds or for as long as possible. Allow the piston or ball to fall to the bottom of the column. Remove the mouthpiece from your  mouth and breathe out normally. Rest for a few seconds and repeat Steps 1 through 7 at least 10 times every 1-2 hours when you are awake. Take your time and take a few normal breaths between deep breaths. The spirometer may include an indicator to show your best effort. Use the indicator as a goal to work toward during each repetition. After each set of 10 deep breaths, practice coughing to be sure your lungs are clear. If you have an incision (the cut made at the time of surgery), support your incision when coughing by placing a pillow or rolled up towels firmly against it. Once you are able to get out of bed, walk around indoors and cough well. You may stop using the incentive spirometer when instructed  by your caregiver.  RISKS AND COMPLICATIONS Take your time so you do not get dizzy or light-headed. If you are in pain, you may need to take or ask for pain medication before doing incentive spirometry. It is harder to take a deep breath if you are having pain. AFTER USE Rest and breathe slowly and easily. It can be helpful to keep track of a log of your progress. Your caregiver can provide you with a simple table to help with this. If you are using the spirometer at home, follow these instructions: SEEK MEDICAL CARE IF:  You are having difficultly using the spirometer. You have trouble using the spirometer as often as instructed. Your pain medication is not giving enough relief while using the spirometer. You develop fever of 100.5 F (38.1 C) or higher. SEEK IMMEDIATE MEDICAL CARE IF:  You cough up bloody sputum that had not been present before. You develop fever of 102 F (38.9 C) or greater. You develop worsening pain at or near the incision site. MAKE SURE YOU:  Understand these instructions. Will watch your condition. Will get help right away if you are not doing well or get worse. Document Released: 01/25/2007 Document Revised: 12/07/2011 Document Reviewed: 03/28/2007 [facility] Patient Information 2014 [place], [place]. WHAT IS A BLOOD TRANSFUSION? Blood Transfusion Information  A transfusion is the replacement of blood or some of its parts. Blood is made up of multiple cells which provide different functions. Red blood cells carry oxygen and are used for blood loss replacement. White blood cells fight against infection. Platelets control bleeding. Plasma helps clot blood. Other blood products are available for specialized needs, such as hemophilia or other clotting disorders. BEFORE THE TRANSFUSION  Who gives blood for transfusions?  Healthy volunteers who are fully evaluated to make sure their blood is safe. This is blood bank blood. Transfusion therapy is the safest it has  ever been in the practice of medicine. Before blood is taken from a donor, a complete history is taken to make sure that person has no history of diseases nor engages in risky social behavior (examples are intravenous drug use or sexual activity with multiple partners). The donor's travel history is screened to minimize risk of transmitting infections, such as malaria. The donated blood is tested for signs of infectious diseases, such as HIV and hepatitis. The blood is then tested to be sure it is compatible with you in order to minimize the chance of a transfusion reaction. If you or a relative donates blood, this is often done in anticipation of surgery and is not appropriate for emergency situations. It takes many days to process the donated blood. RISKS AND COMPLICATIONS Although transfusion therapy is very safe and saves many lives, the  main dangers of transfusion include:  Getting an infectious disease. Developing a transfusion reaction. This is an allergic reaction to something in the blood you were given. Every precaution is taken to prevent this. The decision to have a blood transfusion has been considered carefully by your caregiver before blood is given. Blood is not given unless the benefits outweigh the risks. AFTER THE TRANSFUSION Right after receiving a blood transfusion, you will usually feel much better and more energetic. This is especially true if your red blood cells have gotten low (anemic). The transfusion raises the level of the red blood cells which carry oxygen, and this usually causes an energy increase. The nurse administering the transfusion will monitor you carefully for complications. HOME CARE INSTRUCTIONS  No special instructions are needed after a transfusion. You may find your energy is better. Speak with your caregiver about any limitations on activity for underlying diseases you may have. SEEK MEDICAL CARE IF:  Your condition is not improving after your  transfusion. You develop redness or irritation at the intravenous (IV) site. SEEK IMMEDIATE MEDICAL CARE IF:  Any of the following symptoms occur over the next 12 hours: Shaking chills. You have a temperature by mouth above 102 F (38.9 C), not controlled by medicine. Chest, back, or muscle pain. People around you feel you are not acting correctly or are confused. Shortness of breath or difficulty breathing. Dizziness and fainting. You get a rash or develop hives. You have a decrease in urine output. Your urine turns a dark color or changes to pink, red, or brown. Any of the following symptoms occur over the next 10 days: You have a temperature by mouth above 102 F (38.9 C), not controlled by medicine. Shortness of breath. Weakness after normal activity. The white part of the eye turns yellow (jaundice). You have a decrease in the amount of urine or are urinating less often. Your urine turns a dark color or changes to pink, red, or brown. Document Released: 09/11/2000 Document Revised: 12/07/2011 Document Reviewed: 04/30/2008 [facility] Patient Information 2014 [place], [place].

## 2023-04-30 NOTE — Progress Notes (Signed)
COVID Vaccine received:  []  No [x]  Yes Date of any COVID positive Test in last 90 days: No PCP - Aram Beecham MD Cardiologist -   Chest x-ray -  EKG -  05/03/23  EPIC Stress Test -  ECHO -  Cardiac Cath -   Bowel Prep - []  No  []   Yes ______  Pacemaker / ICD device [x]  No []  Yes   Spinal Cord Stimulator:[x]  No []  Yes       History of Sleep Apnea? [x]  No []  Yes   CPAP used?- [x]  No []  Yes    Does the patient monitor blood sugar?          [x]  No []  Yes  []  N/A  Patient has: [x]  NO Hx DM   []  Pre-DM                 []  DM1  []   DM2 Does patient have a [employer] or Dexacom? []  No []  Yes   Fasting Blood Sugar Ranges-  Checks Blood Sugar _____ times a day  GLP1 agonist / usual dose - No GLP1 instructions:  SGLT-2 inhibitors / usual dose - No SGLT-2 instructions:   Blood Thinner / Instructions:No Aspirin Instructions:No  Comments:   Activity level: Patient is able to climb a flight of stairs without difficulty; [x]  No CP  [x]  No SOB, but would have _hip pain__   Patient can perform ADLs without assistance.   Anesthesia review:   Patient denies shortness of breath, fever, cough and chest pain at PAT appointment.  Patient verbalized understanding and agreement to the Pre-Surgical Instructions that were given to them at this PAT appointment. Patient was also educated of the need to review these PAT instructions again prior to his/her surgery.I reviewed the appropriate phone numbers to call if they have any and questions or concerns.

## 2023-05-03 ENCOUNTER — Encounter (HOSPITAL_COMMUNITY): Payer: Self-pay

## 2023-05-03 ENCOUNTER — Other Ambulatory Visit: Payer: Self-pay

## 2023-05-03 ENCOUNTER — Encounter (HOSPITAL_COMMUNITY)
Admission: RE | Admit: 2023-05-03 | Discharge: 2023-05-03 | Disposition: A | Discharge status: Home / Self Care | Payer: BLUE CROSS/BLUE SHIELD | Source: Ambulatory Visit | Attending: Orthopaedic Surgery | Admitting: Orthopaedic Surgery

## 2023-05-03 VITALS — BP 160/94 | HR 62 | Temp 98.3°F | Resp 16 | Ht 69.0 in | Wt 200.0 lb

## 2023-05-03 DIAGNOSIS — Z01818 Encounter for other preprocedural examination: Secondary | ICD-10-CM | POA: Diagnosis present

## 2023-05-03 DIAGNOSIS — I10 Essential (primary) hypertension: Secondary | ICD-10-CM

## 2023-05-03 DIAGNOSIS — M1611 Unilateral primary osteoarthritis, right hip: Secondary | ICD-10-CM | POA: Diagnosis not present

## 2023-05-03 HISTORY — DX: Unspecified osteoarthritis, unspecified site: M19.90

## 2023-05-03 LAB — COMPREHENSIVE METABOLIC PANEL
ALT: 31 U/L (ref 0–44)
AST: 28 U/L (ref 15–41)
Albumin: 4.1 g/dL (ref 3.5–5.0)
Alkaline Phosphatase: 54 U/L (ref 38–126)
Anion gap: 8 (ref 5–15)
BUN: 18 mg/dL (ref 8–23)
CO2: 24 mmol/L (ref 22–32)
Calcium: 9.2 mg/dL (ref 8.9–10.3)
Chloride: 107 mmol/L (ref 98–111)
Creatinine, Ser: 0.91 mg/dL (ref 0.61–1.24)
GFR, Estimated: >60 mL/min (ref >60)
Glucose, Bld: 111 mg/dL — ABNORMAL HIGH (ref 70–99)
Potassium: 4.1 mmol/L (ref 3.5–5.1)
Sodium: 139 mmol/L (ref 135–145)
Total Bilirubin: 1 mg/dL (ref 0.3–1.2)
Total Protein: 7.1 g/dL (ref 6.5–8.1)

## 2023-05-03 LAB — TYPE AND SCREEN
ABO/RH(D): O POS
Antibody Screen: NEGATIVE

## 2023-05-03 LAB — SURGICAL PCR SCREEN
MRSA, PCR: NEGATIVE
Staphylococcus aureus: NEGATIVE

## 2023-05-03 LAB — CBC
HCT: 44 % (ref 39.0–52.0)
Hemoglobin: 15.2 g/dL (ref 13.0–17.0)
MCH: 31.1 pg (ref 26.0–34.0)
MCHC: 34.5 g/dL (ref 30.0–36.0)
MCV: 90 fL (ref 80.0–100.0)
Platelets: 166 10*3/uL (ref 150–400)
RBC: 4.89 MIL/uL (ref 4.22–5.81)
RDW: 12.2 % (ref 11.5–15.5)
WBC: 4.2 10*3/uL (ref 4.0–10.5)
nRBC: 0 % (ref 0.0–0.2)

## 2023-05-13 NOTE — Progress Notes (Signed)
Called patient regarding updated arrival time of 0745 for surgery and no liquids after 0700. Pt verbalized understanding.

## 2023-05-13 NOTE — H&P (Signed)
TOTAL HIP ADMISSION H&P  Patient is admitted for right total hip arthroplasty.  Subjective:  Chief Complaint: right hip pain  HPI: Shawn Ellison, 67 y.o. male, has a history of pain and functional disability in the right hip(s) due to arthritis and patient has failed non-surgical conservative treatments for greater than 12 weeks to include NSAID's and/or analgesics, corticosteriod injections, and activity modification.  Onset of symptoms was gradual starting 3 years ago with gradually worsening course since that time.The patient noted no past surgery on the right hip(s).  Patient currently rates pain in the right hip at 10 out of 10 with activity. Patient has night pain, worsening of pain with activity and weight bearing, trendelenberg gait, pain that interfers with activities of daily living, and pain with passive range of motion. Patient has evidence of subchondral cysts, subchondral sclerosis, periarticular osteophytes, and joint space narrowing by imaging studies. This condition presents safety issues increasing the risk of falls.  There is no current active infection.  Patient Active Problem List   Diagnosis Date Noted   Unilateral primary osteoarthritis, right hip 02/15/2023   Prostate cancer (HCC) 10/03/2018   Cough 12/12/2012   Skin lesion 11/27/2011   Routine general medical examination at a health care facility 09/20/2011   HYPERLIPIDEMIA 09/15/2010   PERSONAL HISTORY OF OTHER SPECIFIED DISEASES 09/11/2010   SCIATICA, ACUTE 07/03/2010   Past Medical History:  Diagnosis Date   Arthritis    Hyperlipidemia    Hypertension    Sciatica    right L5 distribution- [facility] 2012    Past Surgical History:  Procedure Laterality Date   COLONOSCOPY  03/25/2007   INTERNAL HEMORRHOIDS PER Dr. Glory Buff SURGERY  2002   LAPAROSCOPIC CHOLECYCSTECTOMY  10/30/2007   dR. bYRNETT   LYMPHADENECTOMY Bilateral 10/03/2018   Procedure: LYMPHADENECTOMY, PELVIC;  Surgeon: Heloise Purpura, MD;  Location: [facility];  Service: Urology;  Laterality: Bilateral;   PROSTATE BIOPSY  06/2018   right inguinal hernia   25 years ago   with mesh    RIH     ROBOT ASSISTED LAPAROSCOPIC RADICAL PROSTATECTOMY N/A 10/03/2018   Procedure: XI ROBOTIC ASSISTED LAPAROSCOPIC RADICAL PROSTATECTOMY LEVEL 2;  Surgeon: Heloise Purpura, MD;  Location: [facility];  Service: Urology;  Laterality: N/A;    No current facility-administered medications for this encounter.   Current Outpatient Medications  Medication Sig Dispense Refill Last Dose   atorvastatin (LIPITOR) 10 MG tablet Take 10 mg by mouth daily.       celecoxib (CELEBREX) 100 MG capsule Take 1 capsule by mouth 2 (two) times daily.      gabapentin (NEURONTIN) 300 MG capsule Take 1 capsule (300 mg total) by mouth at bedtime. 30 capsule 1    losartan (COZAAR) 50 MG tablet Take 50 mg by mouth daily.       tiZANidine (ZANAFLEX) 4 MG capsule TAKE 1 CAPSULE (4 MG TOTAL) BY MOUTH 2 (TWO) TIMES DAILY AS NEEDED FOR MUSCLE SPASMS. 60 capsule 1    Allergies  Allergen Reactions   Lisinopril Cough    Social History   Tobacco Use   Smoking status: Never   Smokeless tobacco: Never  Substance Use Topics   Alcohol use: Yes    Comment: occ    Family History  Problem Relation Age of Onset   Hypothyroidism Mother    Hypertension Father    Cancer Father        AML   Hypertension Son    Hypertension Son  Hypertension Brother    Hypertension Maternal Grandfather    Diabetes Maternal Grandfather    Colon cancer Maternal Aunt    Prostate cancer Neg Hx      Review of Systems  Objective:  Physical Exam Vitals reviewed.  Constitutional:      Appearance: Normal appearance. He is normal weight.  HENT:     Head: Normocephalic.  Eyes:     Extraocular Movements: Extraocular movements intact.     Pupils: Pupils are equal, round, and reactive to light.  Cardiovascular:     Rate and Rhythm: Normal rate and regular rhythm.  Pulmonary:     Effort:  Pulmonary effort is normal.     Breath sounds: Normal breath sounds.  Abdominal:     Palpations: Abdomen is soft.  Musculoskeletal:     Cervical back: Normal range of motion and neck supple.     Right hip: Tenderness and bony tenderness present. Decreased range of motion. Decreased strength.  Neurological:     Mental Status: He is alert and oriented to person, place, and time.  Psychiatric:        Behavior: Behavior normal.     Vital signs in last 24 hours:    Labs:   Estimated body mass index is 29.53 kg/m as calculated from the following:   Height as of 05/03/23: 5\' 9"  (1.753 m).   Weight as of 05/03/23: 90.7 kg.   Imaging Review Plain radiographs demonstrate severe degenerative joint disease of the right hip(s). The bone quality appears to be excellent for age and reported activity level.      Assessment/Plan:  End stage arthritis, right hip(s)  The patient history, physical examination, clinical judgement of the provider and imaging studies are consistent with end stage degenerative joint disease of the right hip(s) and total hip arthroplasty is deemed medically necessary. The treatment options including medical management, injection therapy, arthroscopy and arthroplasty were discussed at length. The risks and benefits of total hip arthroplasty were presented and reviewed. The risks due to aseptic loosening, infection, stiffness, dislocation/subluxation,  thromboembolic complications and other imponderables were discussed.  The patient acknowledged the explanation, agreed to proceed with the plan and consent was signed. Patient is being admitted for inpatient treatment for surgery, pain control, PT, OT, prophylactic antibiotics, VTE prophylaxis, progressive ambulation and ADL's and discharge planning.The patient is planning to be discharged home with home health services

## 2023-05-14 ENCOUNTER — Other Ambulatory Visit: Payer: Self-pay

## 2023-05-14 ENCOUNTER — Observation Stay (HOSPITAL_COMMUNITY): Payer: BC Managed Care – PPO

## 2023-05-14 ENCOUNTER — Encounter (HOSPITAL_COMMUNITY)
Admission: RE | Disposition: A | Discharge status: Home Health | Payer: Self-pay | Source: Ambulatory Visit | Attending: Orthopaedic Surgery

## 2023-05-14 ENCOUNTER — Observation Stay (HOSPITAL_COMMUNITY)
Admission: RE | Admit: 2023-05-14 | Discharge: 2023-05-15 | Disposition: A | Discharge status: Home Health | Payer: BC Managed Care – PPO | Source: Ambulatory Visit | Attending: Orthopaedic Surgery | Admitting: Orthopaedic Surgery

## 2023-05-14 ENCOUNTER — Ambulatory Visit (HOSPITAL_COMMUNITY): Payer: BC Managed Care – PPO

## 2023-05-14 ENCOUNTER — Ambulatory Visit (HOSPITAL_COMMUNITY): Payer: BC Managed Care – PPO | Admitting: Anesthesiology

## 2023-05-14 ENCOUNTER — Encounter (HOSPITAL_COMMUNITY): Payer: Self-pay | Admitting: Orthopaedic Surgery

## 2023-05-14 DIAGNOSIS — I10 Essential (primary) hypertension: Secondary | ICD-10-CM | POA: Insufficient documentation

## 2023-05-14 DIAGNOSIS — Z8546 Personal history of malignant neoplasm of prostate: Secondary | ICD-10-CM | POA: Insufficient documentation

## 2023-05-14 DIAGNOSIS — Z79899 Other long term (current) drug therapy: Secondary | ICD-10-CM | POA: Diagnosis not present

## 2023-05-14 DIAGNOSIS — M1611 Unilateral primary osteoarthritis, right hip: Secondary | ICD-10-CM | POA: Diagnosis not present

## 2023-05-14 DIAGNOSIS — Z96641 Presence of right artificial hip joint: Secondary | ICD-10-CM

## 2023-05-14 HISTORY — PX: TOTAL HIP ARTHROPLASTY: SHX124

## 2023-05-14 SURGERY — ARTHROPLASTY, HIP, TOTAL, ANTERIOR APPROACH
Anesthesia: Spinal | Site: Hip | Laterality: Right

## 2023-05-14 MED ORDER — ONDANSETRON HCL 4 MG/2ML IJ SOLN: 4.0000 mg | Freq: Four times a day (QID) | INTRAMUSCULAR | Status: DC | PRN

## 2023-05-14 MED ORDER — ATORVASTATIN CALCIUM 10 MG PO TABS
10.0000 mg | ORAL_TABLET | Freq: Every day | ORAL | Status: DC
  Administered 2023-05-15: 10 mg via ORAL
  Filled 2023-05-14: qty 1

## 2023-05-14 MED ORDER — MIDAZOLAM HCL 2 MG/2ML IJ SOLN
INTRAMUSCULAR | Status: AC
  Filled 2023-05-14: qty 2

## 2023-05-14 MED ORDER — DOCUSATE SODIUM 100 MG PO CAPS
100.0000 mg | ORAL_CAPSULE | Freq: Two times a day (BID) | ORAL | Status: DC
  Administered 2023-05-14 – 2023-05-15 (×3): 100 mg via ORAL
  Filled 2023-05-14 (×3): qty 1

## 2023-05-14 MED ORDER — DIPHENHYDRAMINE HCL 12.5 MG/5ML PO ELIX: 12.5000 mg | ORAL_SOLUTION | ORAL | Status: DC | PRN

## 2023-05-14 MED ORDER — FENTANYL CITRATE (PF) 100 MCG/2ML IJ SOLN
INTRAMUSCULAR | Status: AC
  Filled 2023-05-14: qty 2

## 2023-05-14 MED ORDER — OXYCODONE HCL 5 MG PO TABS
5.0000 mg | ORAL_TABLET | ORAL | Status: DC | PRN
  Administered 2023-05-14: 5 mg via ORAL
  Administered 2023-05-14: 10 mg via ORAL
  Administered 2023-05-14: 5 mg via ORAL
  Administered 2023-05-15 (×3): 10 mg via ORAL
  Filled 2023-05-14 (×2): qty 1
  Filled 2023-05-14 (×4): qty 2

## 2023-05-14 MED ORDER — DEXAMETHASONE SODIUM PHOSPHATE 10 MG/ML IJ SOLN
INTRAMUSCULAR | Status: DC | PRN
  Administered 2023-05-14: 8 mg via INTRAVENOUS

## 2023-05-14 MED ORDER — POVIDONE-IODINE 10 % EX SWAB: 2.0000 | Freq: Once | CUTANEOUS | Status: DC

## 2023-05-14 MED ORDER — DEXAMETHASONE SODIUM PHOSPHATE 10 MG/ML IJ SOLN
INTRAMUSCULAR | Status: AC
  Filled 2023-05-14: qty 1

## 2023-05-14 MED ORDER — SODIUM CHLORIDE 0.9 % IR SOLN
Status: DC | PRN
  Administered 2023-05-14: 1000 mL

## 2023-05-14 MED ORDER — PROPOFOL 10 MG/ML IV BOLUS
INTRAVENOUS | Status: DC | PRN
  Administered 2023-05-14: 30 mg via INTRAVENOUS

## 2023-05-14 MED ORDER — FENTANYL CITRATE PF 50 MCG/ML IJ SOSY
PREFILLED_SYRINGE | INTRAMUSCULAR | Status: AC
  Administered 2023-05-14: 50 ug via INTRAVENOUS
  Filled 2023-05-14: qty 1

## 2023-05-14 MED ORDER — PHENYLEPHRINE HCL-NACL 20-0.9 MG/250ML-% IV SOLN
INTRAVENOUS | Status: DC | PRN
  Administered 2023-05-14: 25 ug/min via INTRAVENOUS

## 2023-05-14 MED ORDER — MENTHOL 3 MG MT LOZG: 1.0000 | LOZENGE | OROMUCOSAL | Status: DC | PRN

## 2023-05-14 MED ORDER — LOSARTAN POTASSIUM 50 MG PO TABS
50.0000 mg | ORAL_TABLET | Freq: Every day | ORAL | Status: DC
  Administered 2023-05-14 – 2023-05-15 (×2): 50 mg via ORAL
  Filled 2023-05-14 (×2): qty 1

## 2023-05-14 MED ORDER — STERILE WATER FOR IRRIGATION IR SOLN
Status: DC | PRN
  Administered 2023-05-14: 2000 mL

## 2023-05-14 MED ORDER — ALUM & MAG HYDROXIDE-SIMETH 200-200-20 MG/5ML PO SUSP: 30.0000 mL | ORAL | Status: DC | PRN

## 2023-05-14 MED ORDER — FENTANYL CITRATE PF 50 MCG/ML IJ SOSY
25.0000 ug | PREFILLED_SYRINGE | INTRAMUSCULAR | Status: DC | PRN
  Administered 2023-05-14: 50 ug via INTRAVENOUS

## 2023-05-14 MED ORDER — METOCLOPRAMIDE HCL 5 MG/ML IJ SOLN: 5.0000 mg | Freq: Three times a day (TID) | INTRAMUSCULAR | Status: DC | PRN

## 2023-05-14 MED ORDER — ONDANSETRON HCL 4 MG PO TABS: 4.0000 mg | ORAL_TABLET | Freq: Four times a day (QID) | ORAL | Status: DC | PRN

## 2023-05-14 MED ORDER — GABAPENTIN 300 MG PO CAPS: 300.0000 mg | ORAL_CAPSULE | Freq: Every day | ORAL | Status: DC

## 2023-05-14 MED ORDER — 0.9 % SODIUM CHLORIDE (POUR BTL) OPTIME
TOPICAL | Status: DC | PRN
  Administered 2023-05-14: 1000 mL

## 2023-05-14 MED ORDER — PROPOFOL 500 MG/50ML IV EMUL
INTRAVENOUS | Status: DC | PRN
  Administered 2023-05-14: 40 ug/kg/min via INTRAVENOUS

## 2023-05-14 MED ORDER — HYDROMORPHONE HCL 1 MG/ML IJ SOLN: 0.5000 mg | INTRAMUSCULAR | Status: DC | PRN

## 2023-05-14 MED ORDER — FENTANYL CITRATE PF 50 MCG/ML IJ SOSY
PREFILLED_SYRINGE | INTRAMUSCULAR | Status: AC
  Administered 2023-05-14: 50 ug via INTRAVENOUS
  Filled 2023-05-14: qty 2

## 2023-05-14 MED ORDER — ACETAMINOPHEN 500 MG PO TABS
1000.0000 mg | ORAL_TABLET | Freq: Once | ORAL | Status: AC
  Administered 2023-05-14: 1000 mg via ORAL
  Filled 2023-05-14: qty 2

## 2023-05-14 MED ORDER — ONDANSETRON HCL 4 MG/2ML IJ SOLN
INTRAMUSCULAR | Status: AC
  Filled 2023-05-14: qty 2

## 2023-05-14 MED ORDER — POLYETHYLENE GLYCOL 3350 17 G PO PACK: 17.0000 g | PACK | Freq: Every day | ORAL | Status: DC | PRN

## 2023-05-14 MED ORDER — METOCLOPRAMIDE HCL 5 MG PO TABS: 5.0000 mg | ORAL_TABLET | Freq: Three times a day (TID) | ORAL | Status: DC | PRN

## 2023-05-14 MED ORDER — LACTATED RINGERS IV SOLN: INTRAVENOUS | Status: DC

## 2023-05-14 MED ORDER — ACETAMINOPHEN 325 MG PO TABS
325.0000 mg | ORAL_TABLET | Freq: Four times a day (QID) | ORAL | Status: DC | PRN
  Filled 2023-05-14: qty 2

## 2023-05-14 MED ORDER — MIDAZOLAM HCL 5 MG/5ML IJ SOLN
INTRAMUSCULAR | Status: DC | PRN
  Administered 2023-05-14: 2 mg via INTRAVENOUS

## 2023-05-14 MED ORDER — ONDANSETRON HCL 4 MG/2ML IJ SOLN
INTRAMUSCULAR | Status: DC | PRN
  Administered 2023-05-14: 4 mg via INTRAVENOUS

## 2023-05-14 MED ORDER — CEFAZOLIN SODIUM-DEXTROSE 2-4 GM/100ML-% IV SOLN
2.0000 g | INTRAVENOUS | Status: AC
  Administered 2023-05-14: 2 g via INTRAVENOUS
  Filled 2023-05-14: qty 100

## 2023-05-14 MED ORDER — FENTANYL CITRATE (PF) 100 MCG/2ML IJ SOLN
INTRAMUSCULAR | Status: DC | PRN
  Administered 2023-05-14: 50 ug via INTRAVENOUS

## 2023-05-14 MED ORDER — ASPIRIN 81 MG PO CHEW
81.0000 mg | CHEWABLE_TABLET | Freq: Two times a day (BID) | ORAL | Status: DC
  Administered 2023-05-14 – 2023-05-15 (×2): 81 mg via ORAL
  Filled 2023-05-14 (×2): qty 1

## 2023-05-14 MED ORDER — PROPOFOL 1000 MG/100ML IV EMUL
INTRAVENOUS | Status: AC
  Filled 2023-05-14: qty 100

## 2023-05-14 MED ORDER — ACETAMINOPHEN 500 MG PO TABS: 1000.0000 mg | ORAL_TABLET | Freq: Once | ORAL | Status: DC

## 2023-05-14 MED ORDER — METHOCARBAMOL 500 MG IVPB - SIMPLE MED: 500.0000 mg | Freq: Four times a day (QID) | INTRAVENOUS | Status: DC | PRN

## 2023-05-14 MED ORDER — ORAL CARE MOUTH RINSE: 15.0000 mL | Freq: Once | OROMUCOSAL | Status: AC

## 2023-05-14 MED ORDER — PHENOL 1.4 % MT LIQD: 1.0000 | OROMUCOSAL | Status: DC | PRN

## 2023-05-14 MED ORDER — METHOCARBAMOL 500 MG IVPB - SIMPLE MED
INTRAVENOUS | Status: AC
  Administered 2023-05-14: 500 mg via INTRAVENOUS
  Filled 2023-05-14: qty 55

## 2023-05-14 MED ORDER — BUPIVACAINE IN DEXTROSE 0.75-8.25 % IT SOLN
INTRATHECAL | Status: DC | PRN
  Administered 2023-05-14: 1.8 mL via INTRATHECAL

## 2023-05-14 MED ORDER — SODIUM CHLORIDE 0.9 % IV SOLN: INTRAVENOUS | Status: DC

## 2023-05-14 MED ORDER — METHOCARBAMOL 500 MG PO TABS
500.0000 mg | ORAL_TABLET | Freq: Four times a day (QID) | ORAL | Status: DC | PRN
  Administered 2023-05-14 – 2023-05-15 (×2): 500 mg via ORAL
  Filled 2023-05-14 (×2): qty 1

## 2023-05-14 MED ORDER — CHLORHEXIDINE GLUCONATE 0.12 % MT SOLN
15.0000 mL | Freq: Once | OROMUCOSAL | Status: AC
  Administered 2023-05-14: 15 mL via OROMUCOSAL

## 2023-05-14 MED ORDER — CEFAZOLIN SODIUM-DEXTROSE 1-4 GM/50ML-% IV SOLN
1.0000 g | Freq: Four times a day (QID) | INTRAVENOUS | Status: AC
  Administered 2023-05-14 (×2): 1 g via INTRAVENOUS
  Filled 2023-05-14 (×2): qty 50

## 2023-05-14 MED ORDER — OXYCODONE HCL 5 MG PO TABS: 10.0000 mg | ORAL_TABLET | ORAL | Status: DC | PRN

## 2023-05-14 MED ORDER — TRANEXAMIC ACID-NACL 1000-0.7 MG/100ML-% IV SOLN
1000.0000 mg | INTRAVENOUS | Status: AC
  Administered 2023-05-14: 1000 mg via INTRAVENOUS
  Filled 2023-05-14: qty 100

## 2023-05-14 MED ORDER — PANTOPRAZOLE SODIUM 40 MG PO TBEC
40.0000 mg | DELAYED_RELEASE_TABLET | Freq: Every day | ORAL | Status: DC
  Administered 2023-05-14 – 2023-05-15 (×2): 40 mg via ORAL
  Filled 2023-05-14 (×2): qty 1

## 2023-05-14 SURGICAL SUPPLY — 42 items
ACETAB CUP W/GRIPTION 54 (Plate) ×1 IMPLANT
APL SKNCLS STERI-STRIP NONHPOA (GAUZE/BANDAGES/DRESSINGS)
BAG COUNTER SPONGE SURGICOUNT (BAG) ×1 IMPLANT
BAG SPEC THK2 15X12 ZIP CLS (MISCELLANEOUS)
BAG SPNG CNTER NS LX DISP (BAG) ×1
BAG ZIPLOCK 12X15 (MISCELLANEOUS) IMPLANT
BENZOIN TINCTURE PRP APPL 2/3 (GAUZE/BANDAGES/DRESSINGS) IMPLANT
BLADE SAW SGTL 18X1.27X75 (BLADE) ×1 IMPLANT
COVER PERINEAL POST (MISCELLANEOUS) ×1 IMPLANT
COVER SURGICAL LIGHT HANDLE (MISCELLANEOUS) ×1 IMPLANT
CUP ACETAB W/GRIPTION 54 (Plate) IMPLANT
DRAPE FOOT SWITCH (DRAPES) ×1 IMPLANT
DRAPE STERI IOBAN 125X83 (DRAPES) ×1 IMPLANT
DRAPE U-SHAPE 47X51 STRL (DRAPES) ×2 IMPLANT
DRSG AQUACEL AG ADV 3.5X10 (GAUZE/BANDAGES/DRESSINGS) ×1 IMPLANT
DURAPREP 26ML APPLICATOR (WOUND CARE) ×1 IMPLANT
ELECT REM PT RETURN 15FT ADLT (MISCELLANEOUS) ×1 IMPLANT
GAUZE XEROFORM 1X8 LF (GAUZE/BANDAGES/DRESSINGS) IMPLANT
GLOVE BIO SURGEON STRL SZ7.5 (GLOVE) ×1 IMPLANT
GLOVE BIOGEL PI IND STRL 8 (GLOVE) ×2 IMPLANT
GLOVE ECLIPSE 8.0 STRL XLNG CF (GLOVE) ×1 IMPLANT
GOWN STRL REUS W/ TWL XL LVL3 (GOWN DISPOSABLE) ×2 IMPLANT
GOWN STRL REUS W/TWL XL LVL3 (GOWN DISPOSABLE) ×2
HANDPIECE INTERPULSE COAX TIP (DISPOSABLE) ×1
HEAD CERAMIC DELTA 36 PLUS 1.5 (Hips) IMPLANT
HOLDER FOLEY CATH W/STRAP (MISCELLANEOUS) ×1 IMPLANT
KIT TURNOVER KIT A (KITS) IMPLANT
LINER NEUTRAL 54X36MM PLUS 4 (Hips) IMPLANT
PACK ANTERIOR HIP CUSTOM (KITS) ×1 IMPLANT
SET HNDPC FAN SPRY TIP SCT (DISPOSABLE) ×1 IMPLANT
STAPLER VISISTAT 35W (STAPLE) IMPLANT
STEM FEM ACTIS STD SZ4 (Stem) IMPLANT
STRIP CLOSURE SKIN 1/2X4 (GAUZE/BANDAGES/DRESSINGS) IMPLANT
SUT ETHIBOND NAB CT1 #1 30IN (SUTURE) ×1 IMPLANT
SUT ETHILON 2 0 PS N (SUTURE) IMPLANT
SUT MNCRL AB 4-0 PS2 18 (SUTURE) IMPLANT
SUT VIC AB 0 CT1 36 (SUTURE) ×1 IMPLANT
SUT VIC AB 1 CT1 36 (SUTURE) ×1 IMPLANT
SUT VIC AB 2-0 CT1 27 (SUTURE) ×2
SUT VIC AB 2-0 CT1 TAPERPNT 27 (SUTURE) ×2 IMPLANT
TRAY FOLEY MTR SLVR 16FR STAT (SET/KITS/TRAYS/PACK) IMPLANT
YANKAUER SUCT BULB TIP NO VENT (SUCTIONS) ×1 IMPLANT

## 2023-05-14 NOTE — Anesthesia Procedure Notes (Signed)
Procedure Name: MAC Date/Time: 05/14/2023 9:55 AM  Performed by: Elisabeth Cara, CRNAPre-anesthesia Checklist: Patient identified, Emergency Drugs available, Suction available, Patient being monitored and Timeout performed Patient Re-evaluated:Patient Re-evaluated prior to induction Oxygen Delivery Method: Simple face mask Placement Confirmation: positive ETCO2 Dental Injury: Teeth and Oropharynx as per pre-operative assessment

## 2023-05-14 NOTE — Transfer of Care (Signed)
Immediate Anesthesia Transfer of Care Note  Patient: Shawn Ellison  Procedure(s) Performed: RIGHT TOTAL HIP ARTHROPLASTY ANTERIOR APPROACH (Right: Hip)  Patient Location: PACU  Anesthesia Type:MAC and Spinal  Level of Consciousness: awake, alert , oriented, and patient cooperative  Airway & Oxygen Therapy: Patient Spontanous Breathing and Patient connected to face mask oxygen  Post-op Assessment: Report given to RN and Post -op Vital signs reviewed and stable  Post vital signs: Reviewed and stable  Last Vitals:  Vitals Value Taken Time  BP 107/66 05/14/23 1135  Temp    Pulse 61 05/14/23 1135  Resp 19 05/14/23 1135  SpO2 99 % 05/14/23 1135  Vitals shown include unfiled device data.  Last Pain:  Vitals:   05/14/23 0749  TempSrc: Oral  PainSc:       Patients Stated Pain Goal: 5 (05/14/23 0747)  Complications: No notable events documented.

## 2023-05-14 NOTE — Op Note (Signed)
Operative Note  Date of operation: 05/14/2023 Preoperative diagnosis: Right hip primary osteoarthritis Postoperative diagnosis: Same  Procedure: Right direct anterior total hip arthroplasty  Implants: Implant Name Type Inv. Item Serial No. Manufacturer Lot No. LRB No. Used Action  LINER NEUTRAL [id number] PLUS 4 - [id number] Hips LINER NEUTRAL [id number] PLUS 4  [employer]PU[employer] [id number] Right 1 Implanted  ACETAB CUP W/GRIPTION 54 - [id number] Plate ACETAB CUP W/GRIPTION 54  DEPU[employer]TH[employer] [id number] Right 1 Implanted  STEM FEM ACTIS STD SZ4 - [id number] Stem STEM FEM ACTIS STD SZ4  DEPUY ORTHOPA[employer]ICS [id number] Right 1 Implanted  HEAD CERAMIC DELTA 36 PLUS 1.5 - [id number] Hips HEAD CERAMIC DELTA 36 PLUS 1.5  DEPU[employer]TH[employer] [id number] Right 1 Implanted   Surgeon: Vanita Panda. Magnus Ivan, MD Assistant: Rexene Edison, PA-C  Anesthesia: Spinal EBL: 150 cc Antibiotics: IV Ancef Complications: None  Indications: The patient is a 67 year old gentleman with debilitating arthritis involving his right hip this been well-documented with clinical exam and x-ray findings.  His right hip pain has been slowly getting worse and he is at the point where it is detrimentally affecting his mobility, his quality of life and his actives daily living.  He has tried conservative treatment for a long period time and at this point he wished to proceed with a hip replacement and we agree with this as well given his x-ray findings combined with his clinical exam findings and the failure conservative treatment.  We did discuss the risk of acute blood loss anemia, nerve or vessel injury, fracture, infection, DVT, dislocation, leg length differences, implant failure and wound healing issues.  He understands her goals are hopefully decrease pain, improve mobility, and improve quality of life.  Procedure description: After informed consent was obtained and the appropriate right hip was marked, the patient was brought to the  operating room and set up on the stretcher where spinal anesthesia was obtained.  He was then laid in supine position on the stretcher and a Foley catheter was placed.  Traction boots were placed on both his feet and he was placed supine on the Hana fracture table with a perineal post in place in both legs and inline skeletal traction devices but no traction applied.  His right operative hip and pelvis were assessed radiographically.  The right hip was prepped and draped with DuraPrep and sterile drapes.  A timeout was called and he was identified as the correct patient and the correct right hip.  An incision was then made just inferior and posterior to the ASIS and carried slightly obliquely down the leg.  Dissection was carried down to the tensor fascia lata muscle and the tensor fascia was then divided longitudinally to proceed with a direct anterior approach to the hip.  Circumflex vessels were identified and cauterized.  The hip capsule identified and opened up in L-type format finding a moderate joint effusion.  Cobra retractors were placed around the medial and lateral femoral neck and a femoral neck cut was made with an oscillating saw just proximal to the lesser trochanter and this Was completed with an osteotome.  A corkscrew guide was placed in the femoral head and the femoral head was removed in its entirety and there was a wide area devoid of cartilage and deformity.  A bent Hohmann was then placed over the medial acetabular rim and remnants of the acetabular labrum and other debris removed.  Reaming was then initiated under direct visualization from a size 43 reamer and stepwise increments going up to  a size 53 reamer with all reamers placed under direct visualization and the last reamer placed under direct fluoroscopy as well in order to obtain the depth of reaming, the inclination and the anteversion.  The real DePuy sector GRIPTION acetabular component size 54 was then placed without difficulty  followed by a 36+4 polyethylene liner.  Attention was then turned to the femur.  With the right leg externally rotated to 120 degrees, extended and adducted, a Mueller retractor was placed medially and a Hohmann retractor behind the greater trochanter.  The lateral joint capsule was released and a box cutting osteotome was used in her femoral canal.  Broaching was then initiated using the Actis broaching system from a size 0 going to a size 4.  With a size 4 in place we trialed a standard offset femoral neck and a 36+1.5 trial hip ball.  The right leg was brought over and up and with traction and internal rotation reduced in the pelvis.  We assessed it radiographically and clinically we are pleased with leg length, offset, range of motion and stability.  The hip was then dislocated and we removed the trial components.  The real Actis femoral component with standard offset size 4 and the real 36+1.5 ceramic head ball with in place without difficulty.  Again this was reduced in the pelvis and we are pleased with leg length, offset, range of motion and stability and this was assessed radiographically and clinically.  The soft tissue was then irrigated with normal saline solution.  Remnants of the joint capsule were closed with interrupted #1 Ethibond suture followed by #1 Vicryl to close the tensor fascia.  0 Vicryl was used to close the deep tissue and 2-0 Vicryl was used to close the subcutaneous tissue.  The skin was closed with staples.  An Aquacel dressing was applied.  The patient was taken to the recovery room in stable condition.  Rexene Edison, PA-C did assist during the entire case and beginning to end and his assistance was medically necessary and crucial for soft tissue management and retraction, helping guide implant placement and a layered closure of the wound.

## 2023-05-14 NOTE — TOC Transition Note (Signed)
Transition of Care [facility]) - CM/SW Discharge Note  Patient Details  Name: Shawn Ellison MRN: [id number] Date of Birth: [birth year]  [facility] CM/SW Contact:  Ewing Schlein, LCSW Phone Number: 05/14/2023, 4:03 PM  Clinical Narrative: Patient is expected to discharge home after passing with PT. CSW met with patient to confirm discharge plan. Patient will go home with HHPT through [facility], which was prearranged in orthopedist's office. Patient has a rolling walker at home, so there are no DME needs at this time. TOC signing off.   Final next level of care: Home w Home Health Services Barriers to Discharge: No Barriers Identified  Patient Goals and CMS Choice CMS Medicare.gov Compare Post Acute Care list provided to:: Patient Choice offered to / list presented to : Patient  Discharge Plan and Services Additional resources added to the After Visit Summary for         DME Arranged: N/A DME Agency: NA HH Arranged: PT HH Agency: [facility] Representative spoke with at [facility] Agency: Prearranged in orthopedist's office  Social Determinants of Health (SDOH) Interventions SDOH Screenings   Food Insecurity: No Food Insecurity (05/14/2023)  Housing: Patient Declined (05/14/2023)  Transportation Needs: No Transportation Needs (05/14/2023)  Utilities: Not At Risk (05/14/2023)  Tobacco Use: Low Risk  (05/14/2023)   Readmission Risk Interventions     No data to display

## 2023-05-14 NOTE — Plan of Care (Signed)
  Problem: Education: Goal: Knowledge of the prescribed therapeutic regimen will improve Outcome: Progressing Goal: Understanding of discharge needs will improve Outcome: Progressing Goal: Individualized Educational Video(s) Outcome: Progressing   

## 2023-05-14 NOTE — Anesthesia Postprocedure Evaluation (Signed)
Anesthesia Post Note  Patient: Shawn Ellison  Procedure(s) Performed: RIGHT TOTAL HIP ARTHROPLASTY ANTERIOR APPROACH (Right: Hip)     Patient location during evaluation: PACU Anesthesia Type: Spinal Level of consciousness: oriented and awake and alert Pain management: pain level controlled Vital Signs Assessment: post-procedure vital signs reviewed and stable Respiratory status: spontaneous breathing, respiratory function stable and patient connected to nasal cannula oxygen Cardiovascular status: blood pressure returned to baseline and stable Postop Assessment: no headache, no backache and no apparent nausea or vomiting Anesthetic complications: no  No notable events documented.  Last Vitals:  Vitals:   05/14/23 1245 05/14/23 1328  BP: 131/81 130/78  Pulse: (!) 53 (!) 59  Resp: 14 16  Temp:  37 C  SpO2: 96% 98%    Last Pain:  Vitals:   05/14/23 1328  TempSrc: Oral  PainSc: 2                  Cy Bresee L Elliotte Marsalis

## 2023-05-14 NOTE — Anesthesia Preprocedure Evaluation (Addendum)
Anesthesia Evaluation  Patient identified by MRN, date of birth, ID band Patient awake    Reviewed: Allergy & Precautions, NPO status , Patient's Chart, lab work & pertinent test results  Airway Mallampati: II  TM Distance: >3 FB Neck ROM: Full    Dental no notable dental hx. (+) Teeth Intact, Dental Advisory Given   Pulmonary neg pulmonary ROS   Pulmonary exam normal breath sounds clear to auscultation       Cardiovascular hypertension, Pt. on medications Normal cardiovascular exam Rhythm:Regular Rate:Normal  HLD   Neuro/Psych  Neuromuscular disease  negative psych ROS   GI/Hepatic negative GI ROS, Neg liver ROS,,,  Endo/Other  negative endocrine ROS    Renal/GU negative Renal ROS  negative genitourinary   Musculoskeletal  (+) Arthritis ,    Abdominal   Peds  Hematology negative hematology ROS (+)   Anesthesia Other Findings   Reproductive/Obstetrics                             Anesthesia Physical Anesthesia Plan  ASA: 2  Anesthesia Plan: Spinal   Post-op Pain Management: Tylenol PO (pre-op)*   Induction:   PONV Risk Score and Plan: Treatment may vary due to age or medical condition, Midazolam, Propofol infusion, Dexamethasone and Ondansetron  Airway Management Planned: Natural Airway  Additional Equipment:   Intra-op Plan:   Post-operative Plan:   Informed Consent: I have reviewed the patients History and Physical, chart, labs and discussed the procedure including the risks, benefits and alternatives for the proposed anesthesia with the patient or authorized representative who has indicated his/her understanding and acceptance.     Dental advisory given  Plan Discussed with: CRNA  Anesthesia Plan Comments:        Anesthesia Quick Evaluation

## 2023-05-14 NOTE — Evaluation (Signed)
Physical Therapy Evaluation Patient Details Name: Shawn Ellison MRN: [id number] DOB: 1956/08/03 Today's Date: 05/14/2023  History of Present Illness  Pt  s/p R THR  Clinical Impression  Pt s/p R THR and presents with decreased R LE strength/ROM and post op pain limiting functional mobility.  Pt should progress to dc home with assist of family       If plan is discharge home, recommend the following: A little help with walking and/or transfers;A little help with bathing/dressing/bathroom;Assistance with cooking/housework;Assist for transportation;Help with stairs or ramp for entrance   Can travel by private vehicle        Equipment Recommendations None recommended by PT  Recommendations for Other Services       Functional Status Assessment Patient has had a recent decline in their functional status and demonstrates the ability to make significant improvements in function in a reasonable and predictable amount of time.     Precautions / Restrictions Precautions Precautions: Fall Restrictions Weight Bearing Restrictions: No Other Position/Activity Restrictions: WBAT      Mobility  Bed Mobility Overal bed mobility: Needs Assistance Bed Mobility: Supine to Sit     Supine to sit: Min assist     General bed mobility comments: cues for sequence and use of L LE to self assist    Transfers Overall transfer level: Needs assistance Equipment used: Rolling walker (2 wheels) Transfers: Sit to/from Stand Sit to Stand: Min assist, Mod assist           General transfer comment: cues for LE management and use of UE to self assist.  Physical assist to bring wt up and fwd and to balance in standing with RW    Ambulation/Gait Ambulation/Gait assistance: Min assist, Mod assist Gait Distance (Feet): 5 Feet Assistive device: Rolling walker (2 wheels) Gait Pattern/deviations: Step-to pattern, Decreased step length - right, Decreased step length - left, Shuffle, Trunk flexed Gait  velocity: decr     General Gait Details: cues for sequence, posture and position from RW; distance ltd by pt instability with pt noting residual numbness in groin area with standing  Stairs            Wheelchair Mobility     Tilt Bed    Modified Rankin (Stroke Patients Only)       Balance Overall balance assessment: Needs assistance Sitting-balance support: No upper extremity supported, Feet supported Sitting balance-Leahy Scale: Good     Standing balance support: Bilateral upper extremity supported Standing balance-Leahy Scale: Poor                               Pertinent Vitals/Pain Pain Assessment Pain Assessment: 0-10 Pain Score: 4  Pain Location: R hip Pain Descriptors / Indicators: Aching, Sore Pain Intervention(s): Limited activity within patient's tolerance, Monitored during session, Premedicated before session, Ice applied    Home Living Family/patient expects to be discharged to:: Private residence Living Arrangements: Spouse/significant other Available Help at Discharge: Family;Available 24 hours/day Type of Home: House Home Access: Stairs to enter Entrance Stairs-Rails: Right Entrance Stairs-Number of Steps: 9 with rail vs 2 with no rail   Home Layout: Able to live on main level with bedroom/bathroom Home Equipment: Rolling Walker (2 wheels);Cane - single point      Prior Function Prior Level of Function : Independent/Modified Independent                     Extremity/Trunk Assessment  Upper Extremity Assessment Upper Extremity Assessment: Overall WFL for tasks assessed    Lower Extremity Assessment Lower Extremity Assessment: RLE deficits/detail    Cervical / Trunk Assessment Cervical / Trunk Assessment: Normal  Communication   Communication Communication: No apparent difficulties Cueing Techniques: Verbal cues;Tactile cues  Cognition Arousal: Alert Behavior During Therapy: WFL for tasks  assessed/performed Overall Cognitive Status: Within Functional Limits for tasks assessed                                          General Comments      Exercises Total Joint Exercises Ankle Circles/Pumps: AROM, Both, 15 reps, Supine   Assessment/Plan    PT Assessment Patient needs continued PT services  PT Problem List Decreased strength;Decreased range of motion;Decreased activity tolerance;Decreased balance;Decreased mobility;Decreased knowledge of use of DME;Pain       PT Treatment Interventions DME instruction;Stair training;Gait training;Functional mobility training;Therapeutic activities;Therapeutic exercise;Patient/family education    PT Goals (Current goals can be found in the Care Plan section)  Acute Rehab PT Goals Patient Stated Goal: Regain IND PT Goal Formulation: With patient Time For Goal Achievement: 05/21/23 Potential to Achieve Goals: Good    Frequency 7X/week     Co-evaluation               AM-PAC PT "6 Clicks" Mobility  Outcome Measure Help needed turning from your back to your side while in a flat bed without using bedrails?: A Little Help needed moving from lying on your back to sitting on the side of a flat bed without using bedrails?: A Little Help needed moving to and from a bed to a chair (including a wheelchair)?: A Little Help needed standing up from a chair using your arms (e.g., wheelchair or bedside chair)?: A Little Help needed to walk in hospital room?: A Lot Help needed climbing 3-5 steps with a railing? : Total 6 Click Score: 15    End of Session Equipment Utilized During Treatment: Gait belt Activity Tolerance: Patient tolerated treatment well Patient left: in chair;with call bell/phone within reach Nurse Communication: Mobility status PT Visit Diagnosis: Difficulty in walking, not elsewhere classified (R26.2)    Time: 1324-4010 PT Time Calculation (min) (ACUTE ONLY): 28 min   Charges:   PT  Evaluation $PT Eval Low Complexity: 1 Low   PT General Charges $$ ACUTE PT VISIT: 1 Visit         Mauro Kaufmann PT Acute Rehabilitation Services Pager [phone] Office [phone]   Tremane Spurgeon 05/14/2023, 6:07 PM

## 2023-05-14 NOTE — Anesthesia Procedure Notes (Addendum)
Spinal  Patient location during procedure: OR Start time: 05/14/2023 10:00 AM End time: 05/14/2023 10:05 AM Reason for block: surgical anesthesia Staffing Performed: anesthesiologist  Anesthesiologist: Elmer Picker, MD Performed by: Elmer Picker, MD Authorized by: Elmer Picker, MD   Preanesthetic Checklist Completed: patient identified, IV checked, risks and benefits discussed, surgical consent, monitors and equipment checked, pre-op evaluation and timeout performed Spinal Block Patient position: sitting Prep: DuraPrep and site prepped and draped Patient monitoring: cardiac monitor, continuous pulse ox and blood pressure Approach: midline Location: L3-4 Injection technique: single-shot Needle Needle type: Pencan  Needle gauge: 24 G Needle length: 9 cm Assessment Sensory level: T6 Events: CSF return and second provider Additional Notes Functioning IV was confirmed and monitors were applied. Sterile prep and drape, including hand hygiene and sterile gloves were used. The patient was positioned and the spine was prepped. The skin was anesthetized with lidocaine.  Free flow of clear CSF was obtained prior to injecting local anesthetic into the CSF.  The spinal needle aspirated freely following injection.  The needle was carefully withdrawn.  The patient tolerated the procedure well.

## 2023-05-14 NOTE — Interval H&P Note (Signed)
History and Physical Interval Note: The patient understands that he is here today for a right total hip replacement to treat his severe right hip arthritis.  There has been no acute or interval change in his medical status.  The risks and benefits of surgery have been discussed in detail and informed consent has been obtained.  The right operative hip has been marked.  05/14/2023 8:39 AM  Shawn Ellison  has presented today for surgery, with the diagnosis of osteoarthritis right hip.  The various methods of treatment have been discussed with the patient and family. After consideration of risks, benefits and other options for treatment, the patient has consented to  Procedure(s): RIGHT TOTAL HIP ARTHROPLASTY ANTERIOR APPROACH (Right) as a surgical intervention.  The patient's history has been reviewed, patient examined, no change in status, stable for surgery.  I have reviewed the patient's chart and labs.  Questions were answered to the patient's satisfaction.     Kathryne Hitch

## 2023-05-15 DIAGNOSIS — M1611 Unilateral primary osteoarthritis, right hip: Secondary | ICD-10-CM | POA: Diagnosis not present

## 2023-05-15 LAB — BASIC METABOLIC PANEL
Anion gap: 6 (ref 5–15)
BUN: 14 mg/dL (ref 8–23)
CO2: 23 mmol/L (ref 22–32)
Calcium: 8.8 mg/dL — ABNORMAL LOW (ref 8.9–10.3)
Chloride: 107 mmol/L (ref 98–111)
Creatinine, Ser: 0.93 mg/dL (ref 0.61–1.24)
GFR, Estimated: >60 mL/min (ref >60)
Glucose, Bld: 172 mg/dL — ABNORMAL HIGH (ref 70–99)
Potassium: 4.2 mmol/L (ref 3.5–5.1)
Sodium: 136 mmol/L (ref 135–145)

## 2023-05-15 LAB — CBC
HCT: 39.5 % (ref 39.0–52.0)
Hemoglobin: 13.6 g/dL (ref 13.0–17.0)
MCH: 31.3 pg (ref 26.0–34.0)
MCHC: 34.4 g/dL (ref 30.0–36.0)
MCV: 90.8 fL (ref 80.0–100.0)
Platelets: 182 10*3/uL (ref 150–400)
RBC: 4.35 MIL/uL (ref 4.22–5.81)
RDW: 12.2 % (ref 11.5–15.5)
WBC: 11.8 10*3/uL — ABNORMAL HIGH (ref 4.0–10.5)
nRBC: 0 % (ref 0.0–0.2)

## 2023-05-15 MED ORDER — CELECOXIB 100 MG PO CAPS: 100.0000 mg | ORAL_CAPSULE | Freq: Two times a day (BID) | ORAL | 0 refills | Status: DC | PRN

## 2023-05-15 MED ORDER — ASPIRIN 81 MG PO CHEW: 81.0000 mg | CHEWABLE_TABLET | Freq: Two times a day (BID) | ORAL | 0 refills | Status: DC

## 2023-05-15 MED ORDER — OXYCODONE HCL 5 MG PO TABS: 5.0000 mg | ORAL_TABLET | Freq: Four times a day (QID) | ORAL | 0 refills | Status: DC | PRN

## 2023-05-15 MED ORDER — METHOCARBAMOL 500 MG PO TABS: 500.0000 mg | ORAL_TABLET | Freq: Four times a day (QID) | ORAL | 1 refills | Status: DC | PRN

## 2023-05-15 NOTE — Discharge Instructions (Signed)

## 2023-05-15 NOTE — Discharge Summary (Signed)
Patient ID: Shawn Ellison MRN: [id number] DOB/AGE: 67-Apr-1957 27 y.o.  Admit date: 05/14/2023 Discharge date: 05/15/2023  Admission Diagnoses:  Principal Problem:   Unilateral primary osteoarthritis, right hip Active Problems:   Status post total replacement of right hip   Discharge Diagnoses:  Same  Past Medical History:  Diagnosis Date   Arthritis    Hyperlipidemia    Hypertension    Sciatica    right L5 distribution- [facility] 2012    Surgeries: Procedure(s): RIGHT TOTAL HIP ARTHROPLASTY ANTERIOR APPROACH on 05/14/2023   Consultants:   Discharged Condition: Improved  Hospital Course: Khmari Vanderford is an 67 y.o. male who was admitted 05/14/2023 for operative treatment ofUnilateral primary osteoarthritis, right hip. Patient has severe unremitting pain that affects sleep, daily activities, and work/hobbies. After pre-op clearance the patient was taken to the operating room on 05/14/2023 and underwent  Procedure(s): RIGHT TOTAL HIP ARTHROPLASTY ANTERIOR APPROACH.    Patient was given perioperative antibiotics:  Anti-infectives (From admission, onward)    Start     Dose/Rate Route Frequency Ordered Stop   05/14/23 1600  ceFAZolin (ANCEF) IVPB 1 g/50 mL premix        1 g 100 mL/hr over 30 Minutes Intravenous Every 6 hours 05/14/23 1333 05/14/23 2150   05/14/23 0745  ceFAZolin (ANCEF) IVPB 2g/100 mL premix        2 g 200 mL/hr over 30 Minutes Intravenous On call to O.R. 05/14/23 0733 05/14/23 1036        Patient was given sequential compression devices, early ambulation, and chemoprophylaxis to prevent DVT.  Patient benefited maximally from hospital stay and there were no complications.    Recent vital signs: Patient Vitals for the past 24 hrs:  BP Temp Temp src Pulse Resp SpO2 Height Weight  05/15/23 1058 (!) 159/83 97.8 F (36.6 C) Oral 64 -- 98 % -- --  05/15/23 0613 132/75 98.3 F (36.8 C) Oral 64 18 96 % -- --  05/15/23 0134 (!) 141/69 98.2 F (36.8 C)  Oral 62 19 96 % -- --  05/14/23 2042 (!) 146/81 97.7 F (36.5 C) -- 63 18 95 % -- --  05/14/23 1843 (!) 150/83 97.8 F (36.6 C) Oral 61 18 97 % -- --  05/14/23 1540 (!) 147/89 98.1 F (36.7 C) -- 62 17 98 % -- --  05/14/23 1328 130/78 98.6 F (37 C) Oral (!) 59 16 98 % 5\' 9"  (1.753 m) 90.7 kg  05/14/23 1245 131/81 -- -- (!) 53 14 96 % -- --  05/14/23 1230 -- -- -- 63 16 96 % -- --  05/14/23 1215 118/74 -- -- (!) 53 11 91 % -- --  05/14/23 1200 121/74 -- -- (!) 58 17 95 % -- --  05/14/23 1145 113/76 -- -- (!) 59 16 95 % -- --     Recent laboratory studies:  Recent Labs    05/15/23 0315  WBC 11.8*  HGB 13.6  HCT 39.5  PLT 182  NA 136  K 4.2  CL 107  CO2 23  BUN 14  CREATININE 0.93  GLUCOSE 172*  CALCIUM 8.8*     Discharge Medications:   Allergies as of 05/15/2023       Reactions   Lisinopril Cough        Medication List     STOP taking these medications    gabapentin 300 MG capsule Commonly known as: NEURONTIN       TAKE these medications    aspirin  81 MG chewable tablet Chew 1 tablet (81 mg total) by mouth 2 (two) times daily.   atorvastatin 10 MG tablet Commonly known as: LIPITOR Take 10 mg by mouth daily.   celecoxib 100 MG capsule Commonly known as: CELEBREX Take 1 capsule (100 mg total) by mouth 2 (two) times daily between meals as needed. What changed:  when to take this reasons to take this   losartan 50 MG tablet Commonly known as: COZAAR Take 50 mg by mouth daily.   methocarbamol 500 MG tablet Commonly known as: ROBAXIN Take 1 tablet (500 mg total) by mouth every 6 (six) hours as needed for muscle spasms.   oxyCODONE 5 MG immediate release tablet Commonly known as: Oxy IR/ROXICODONE Take 1-2 tablets (5-10 mg total) by mouth every 6 (six) hours as needed for moderate pain (pain score 4-6).               Durable Medical Equipment  (From admission, onward)           Start     Ordered   05/14/23 1334  DME 3 n 1  Once         05/14/23 1333   05/14/23 1334  DME Walker rolling  Once       Question Answer Comment  Walker: With 5 Inch Wheels   Patient needs a walker to treat with the following condition Status post total replacement of right hip      05/14/23 1333            Diagnostic Studies: DG Pelvis Portable  Result Date: 05/14/2023 CLINICAL DATA:  Status post hip replacement EXAM: PORTABLE PELVIS 1-2 VIEWS COMPARISON:  02/15/2023 FINDINGS: SI joints are non widened. Pubic symphysis and rami are intact. Interval right hip replacement with small volume gas in the soft tissues consistent with recent surgery. No fracture. Normal alignment. IMPRESSION: Status post right hip replacement with expected postsurgical change. Electronically Signed   By: Jasmine Pang M.D.   On: 05/14/2023 15:04   DG HIP UNILAT WITH PELVIS 1V RIGHT  Result Date: 05/14/2023 CLINICAL DATA:  Elective surgery EXAM: DG HIP (WITH OR WITHOUT PELVIS) 1V RIGHT COMPARISON:  02/15/2023 FINDINGS: Three low resolution intraoperative spot views of the right hip. Total fluoroscopy time was 16 seconds, fluoroscopic dose of 2.1 mGy. The images demonstrate a right hip replacement with normal alignment IMPRESSION: Intraoperative fluoroscopic assistance provided during right hip surgery Electronically Signed   By: Jasmine Pang M.D.   On: 05/14/2023 15:02   DG C-Arm 1-60 Min-No Report  Result Date: 05/14/2023 Fluoroscopy was utilized by the requesting physician.  No radiographic interpretation.    Disposition: Discharge disposition: 01-Home or Self Care          Follow-up Information     [facility] Follow up.   Why: [facility] will provide PT in the home after discharge.        Kathryne Hitch, MD Follow up in 2 week(s).   Specialty: Orthopedic Surgery Contact information: [address] [place] [address] [phone]                  Signed: Kathryne Hitch 05/15/2023, 11:39  AM

## 2023-05-15 NOTE — Progress Notes (Signed)
Subjective: 1 Day Post-Op Procedure(s) (LRB): RIGHT TOTAL HIP ARTHROPLASTY ANTERIOR APPROACH (Right) Patient reports pain as moderate.    Objective: Vital signs in last 24 hours: Temp:  [97.7 F (36.5 C)-98.6 F (37 C)] 97.8 F (36.6 C) (08/17 1058) Pulse Rate:  [53-64] 64 (08/17 1058) Resp:  [11-19] 18 (08/17 0613) BP: (113-159)/(69-89) 159/83 (08/17 1058) SpO2:  [91 %-98 %] 98 % (08/17 1058) Weight:  [90.7 kg] 90.7 kg (08/16 1328)  Intake/Output from previous day: 08/16 0701 - 08/17 0700 In: 3390.1 [P.O.:480; I.V.:2605; IV Piggyback:305] Out: 2470 [Urine:2320; Blood:150] Intake/Output this shift: Total I/O In: 300 [P.O.:300] Out: 200 [Urine:200]  Recent Labs    05/15/23 0315  HGB 13.6   Recent Labs    05/15/23 0315  WBC 11.8*  RBC 4.35  HCT 39.5  PLT 182   Recent Labs    05/15/23 0315  NA 136  K 4.2  CL 107  CO2 23  BUN 14  CREATININE 0.93  GLUCOSE 172*  CALCIUM 8.8*   No results for input(s): "LABPT", "INR" in the last 72 hours.  Sensation intact distally Intact pulses distally Dorsiflexion/Plantar flexion intact Incision: dressing C/D/I   Assessment/Plan: 1 Day Post-Op Procedure(s) (LRB): RIGHT TOTAL HIP ARTHROPLASTY ANTERIOR APPROACH (Right) Up with therapy Discharge home with home health      Kathryne Hitch 05/15/2023, 11:37 AM

## 2023-05-15 NOTE — Progress Notes (Addendum)
Physical Therapy Treatment Patient Details Name: Shawn Ellison MRN: [id number] DOB: 04/01/1956 Today's Date: 05/15/2023   History of Present Illness Pt  s/p R THR    PT Comments  Pt motivated and progressing well with mobility.  Pt up to ambulate in hall, reviewed bed mobility tasks, HEP initiated and pt and spouse with many questions asked and answered.  Pt hopeful for dc home this pm.    If plan is discharge home, recommend the following: A little help with walking and/or transfers;A little help with bathing/dressing/bathroom;Assistance with cooking/housework;Assist for transportation;Help with stairs or ramp for entrance   Can travel by private vehicle        Equipment Recommendations  None recommended by PT    Recommendations for Other Services       Precautions / Restrictions Precautions Precautions: Fall Restrictions Weight Bearing Restrictions: No Other Position/Activity Restrictions: WBAT     Mobility  Bed Mobility Overal bed mobility: Needs Assistance Bed Mobility: Supine to Sit, Sit to Supine     Supine to sit: Supervision Sit to supine: Contact guard assist, Supervision   General bed mobility comments: cues for sequence and use of gait belt to self assist    Transfers Overall transfer level: Needs assistance Equipment used: Rolling walker (2 wheels) Transfers: Sit to/from Stand Sit to Stand: Contact guard assist           General transfer comment: cues for LE management and use of UE to self assist.  Physical assist to bring wt up and fwd and to balance in standing with RW    Ambulation/Gait Ambulation/Gait assistance: Contact guard assist, Supervision Gait Distance (Feet): 150 Feet Assistive device: Rolling walker (2 wheels) Gait Pattern/deviations: Decreased step length - right, Decreased step length - left, Shuffle, Trunk flexed, Step-to pattern, Step-through pattern Gait velocity: decr     General Gait Details: cues for sequence, posture  and position from RW;   Stairs             Wheelchair Mobility     Tilt Bed    Modified Rankin (Stroke Patients Only)       Balance Overall balance assessment: Needs assistance Sitting-balance support: No upper extremity supported, Feet supported Sitting balance-Leahy Scale: Good     Standing balance support: Single extremity supported Standing balance-Leahy Scale: Fair                              Cognition Arousal: Alert Behavior During Therapy: WFL for tasks assessed/performed Overall Cognitive Status: Within Functional Limits for tasks assessed                                          Exercises Total Joint Exercises Ankle Circles/Pumps: AROM, Both, 15 reps, Supine Quad Sets: AROM, Both, 10 reps, Supine Heel Slides: AAROM, Right, 20 reps, Supine Hip ABduction/ADduction: AAROM, Right, 15 reps, Supine Long Arc Quad: AAROM, Right, 10 reps, Seated    General Comments        Pertinent Vitals/Pain Pain Assessment Pain Assessment: 0-10 Pain Score: 3  Pain Location: R hip Pain Descriptors / Indicators: Aching, Sore Pain Intervention(s): Limited activity within patient's tolerance, Monitored during session, Premedicated before session, Ice applied    Home Living  Prior Function            PT Goals (current goals can now be found in the care plan section) Acute Rehab PT Goals Patient Stated Goal: Regain IND PT Goal Formulation: With patient Time For Goal Achievement: 05/21/23 Potential to Achieve Goals: Good Progress towards PT goals: Progressing toward goals    Frequency    7X/week      PT Plan      Co-evaluation              AM-PAC PT "6 Clicks" Mobility   Outcome Measure  Help needed turning from your back to your side while in a flat bed without using bedrails?: A Little Help needed moving from lying on your back to sitting on the side of a flat bed without using  bedrails?: A Little Help needed moving to and from a bed to a chair (including a wheelchair)?: A Little Help needed standing up from a chair using your arms (e.g., wheelchair or bedside chair)?: A Little Help needed to walk in hospital room?: A Little Help needed climbing 3-5 steps with a railing? : A Lot 6 Click Score: 17    End of Session Equipment Utilized During Treatment: Gait belt Activity Tolerance: Patient tolerated treatment well Patient left: in chair;with call bell/phone within reach Nurse Communication: Mobility status PT Visit Diagnosis: Difficulty in walking, not elsewhere classified (R26.2)     Time: 8657-8469 PT Time Calculation (min) (ACUTE ONLY): 44 min  Charges:    $Gait Training: 8-22 mins $Therapeutic Exercise: 8-22 mins $Therapeutic Activity: 8-22 mins PT General Charges $$ ACUTE PT VISIT: 1 Visit                     Mauro Kaufmann PT Acute Rehabilitation Services Pager [phone] Office [phone]    Shawn Ellison 05/15/2023, 12:57 PM

## 2023-05-15 NOTE — Progress Notes (Signed)
Physical Therapy Treatment Patient Details Name: Shawn Ellison MRN: [id number] DOB: August 16, 1956 Today's Date: 05/15/2023   History of Present Illness Pt  s/p R THR    PT Comments  Pt progressing well with mobility and eager for dc home this date.  Pt up to ambulate in hall, negotiated stairs, reviewed written HEP, and reviewed dressing.      If plan is discharge home, recommend the following: A little help with walking and/or transfers;A little help with bathing/dressing/bathroom;Assistance with cooking/housework;Assist for transportation;Help with stairs or ramp for entrance   Can travel by private vehicle        Equipment Recommendations  None recommended by PT    Recommendations for Other Services       Precautions / Restrictions Precautions Precautions: Fall Restrictions Weight Bearing Restrictions: No Other Position/Activity Restrictions: WBAT     Mobility  Bed Mobility Overal bed mobility: Needs Assistance Bed Mobility: Supine to Sit, Sit to Supine     Supine to sit: Supervision Sit to supine: Contact guard assist, Supervision   General bed mobility comments: Pt up in recliner and requests back to same    Transfers Overall transfer level: Needs assistance Equipment used: Rolling walker (2 wheels) Transfers: Sit to/from Stand Sit to Stand: Contact guard assist, Supervision           General transfer comment: cues for LE management and use of UE to self assist.    Ambulation/Gait Ambulation/Gait assistance: Contact guard assist, Supervision Gait Distance (Feet): 75 Feet Assistive device: Rolling walker (2 wheels) Gait Pattern/deviations: Decreased step length - right, Decreased step length - left, Shuffle, Trunk flexed, Step-to pattern, Step-through pattern Gait velocity: decr     General Gait Details: min cues for sequence, posture and position from RW;   Stairs Stairs: Yes Stairs assistance: Min assist Stair Management: One rail Right, Step to  pattern, Forwards, With walker, With cane Number of Stairs: 4 General stair comments: 2 steps twice - with cane and rail and with door frame and RW.  CUes for sequence and spouse present   Wheelchair Mobility     Tilt Bed    Modified Rankin (Stroke Patients Only)       Balance Overall balance assessment: Needs assistance Sitting-balance support: No upper extremity supported, Feet supported Sitting balance-Leahy Scale: Good     Standing balance support: Single extremity supported Standing balance-Leahy Scale: Fair                              Cognition Arousal: Alert Behavior During Therapy: WFL for tasks assessed/performed Overall Cognitive Status: Within Functional Limits for tasks assessed                                          Exercises Total Joint Exercises Ankle Circles/Pumps: AROM, Both, 15 reps, Supine Quad Sets: AROM, Both, 10 reps, Supine Heel Slides: AAROM, Right, 20 reps, Supine Hip ABduction/ADduction: AAROM, Right, 15 reps, Supine Long Arc Quad: AAROM, Right, 10 reps, Seated    General Comments        Pertinent Vitals/Pain Pain Assessment Pain Assessment: 0-10 Pain Score: 4  Pain Location: R hip Pain Descriptors / Indicators: Aching, Sore Pain Intervention(s): Limited activity within patient's tolerance, Premedicated before session, Monitored during session, Patient requesting pain meds-RN notified, Ice applied    Home Living  Prior Function            PT Goals (current goals can now be found in the care plan section) Acute Rehab PT Goals Patient Stated Goal: Regain IND PT Goal Formulation: With patient Time For Goal Achievement: 05/21/23 Potential to Achieve Goals: Good Progress towards PT goals: Progressing toward goals    Frequency    7X/week      PT Plan      Co-evaluation              AM-PAC PT "6 Clicks" Mobility   Outcome Measure  Help needed  turning from your back to your side while in a flat bed without using bedrails?: A Little Help needed moving from lying on your back to sitting on the side of a flat bed without using bedrails?: A Little Help needed moving to and from a bed to a chair (including a wheelchair)?: A Little Help needed standing up from a chair using your arms (e.g., wheelchair or bedside chair)?: A Little Help needed to walk in hospital room?: A Little Help needed climbing 3-5 steps with a railing? : A Little 6 Click Score: 18    End of Session Equipment Utilized During Treatment: Gait belt Activity Tolerance: Patient tolerated treatment well Patient left: in chair;with call bell/phone within reach Nurse Communication: Mobility status PT Visit Diagnosis: Difficulty in walking, not elsewhere classified (R26.2)     Time: 1150-1209 PT Time Calculation (min) (ACUTE ONLY): 19 min  Charges:    $Gait Training: 8-22 mins $Therapeutic Exercise: 8-22 mins $Therapeutic Activity: 8-22 mins PT General Charges $$ ACUTE PT VISIT: 1 Visit                     Mauro Kaufmann PT Acute Rehabilitation Services Pager [phone] Office [phone]    Ivah Girardot 05/15/2023, 1:03 PM  3

## 2023-05-17 ENCOUNTER — Encounter (HOSPITAL_COMMUNITY): Payer: Self-pay | Admitting: Orthopaedic Surgery

## 2023-05-18 ENCOUNTER — Other Ambulatory Visit: Payer: Self-pay | Admitting: Orthopaedic Surgery

## 2023-05-18 ENCOUNTER — Telehealth: Payer: Self-pay

## 2023-05-18 MED ORDER — TRAMADOL HCL 50 MG PO TABS: 50.0000 mg | ORAL_TABLET | Freq: Four times a day (QID) | ORAL | 0 refills | Status: DC | PRN

## 2023-05-18 NOTE — Telephone Encounter (Signed)
Patient called wondering if he could have Tramadol instead of the oxycodone? States oxycodone is really upsetting his stomach [facility]

## 2023-05-19 ENCOUNTER — Telehealth: Payer: Self-pay | Admitting: Orthopaedic Surgery

## 2023-05-19 ENCOUNTER — Other Ambulatory Visit: Payer: Self-pay | Admitting: Orthopaedic Surgery

## 2023-05-19 MED ORDER — ONDANSETRON 4 MG PO TBDP: 4.0000 mg | ORAL_TABLET | Freq: Three times a day (TID) | ORAL | 0 refills | Status: DC | PRN

## 2023-05-19 MED ORDER — GABAPENTIN 100 MG PO CAPS: 100.0000 mg | ORAL_CAPSULE | Freq: Three times a day (TID) | ORAL | 1 refills | Status: DC | PRN

## 2023-05-19 NOTE — Telephone Encounter (Signed)
I called and talked to the pt. He said he is having a burning pain as well and would like something sent in for that as well

## 2023-05-19 NOTE — Telephone Encounter (Signed)
Pt called requesting a call. Pt having stomach and asking for nausea meds. Pt phone number is [phone].

## 2023-05-27 ENCOUNTER — Encounter: Payer: Self-pay | Admitting: Orthopaedic Surgery

## 2023-05-27 ENCOUNTER — Ambulatory Visit (INDEPENDENT_AMBULATORY_CARE_PROVIDER_SITE_OTHER): Payer: BC Managed Care – PPO | Admitting: Orthopaedic Surgery

## 2023-05-27 DIAGNOSIS — Z96641 Presence of right artificial hip joint: Secondary | ICD-10-CM

## 2023-05-27 NOTE — Progress Notes (Signed)
The patient is here for his first postoperative visit status post a right total hip replacement.  He is only taken Tylenol for pain.  He has dealt with sciatica so he has been on Neurontin but he is weaning himself from that as well.  He is walking without assistive device.  He would like to go ahead and start mowing his lawn.  He had been compliant with a baby aspirin twice a day.  His calf is soft.  There is no swelling.  His right hip incision looks great and the staples were removed and Steri-Strips applied.  His leg lengths appear equal.  He will continue to slowly increase his activities as comfort allows.  We will see him back in 4 weeks to see how he is doing overall but no x-rays are needed.  Hopefully the sciatica will continue to subside.

## 2023-06-09 ENCOUNTER — Other Ambulatory Visit: Payer: Self-pay | Admitting: Orthopaedic Surgery

## 2023-06-09 ENCOUNTER — Telehealth: Payer: Self-pay | Admitting: Orthopaedic Surgery

## 2023-06-09 MED ORDER — PREDNISONE 50 MG PO TABS: ORAL_TABLET | ORAL | 0 refills | Status: DC

## 2023-06-09 NOTE — Telephone Encounter (Signed)
Patient's wife called. Says his pain is real bad. Can not manage the pain. Would like to know what he can do. Says the pain is not from his hip. More in his buttocks down to his leg. Her call back number is [phone]

## 2023-06-09 NOTE — Telephone Encounter (Signed)
Lvm for pt to cb 

## 2023-06-10 ENCOUNTER — Telehealth: Payer: Self-pay | Admitting: Orthopaedic Surgery

## 2023-06-10 ENCOUNTER — Encounter: Payer: Self-pay | Admitting: Orthopaedic Surgery

## 2023-06-10 NOTE — Telephone Encounter (Signed)
See pt portal message  

## 2023-06-10 NOTE — Telephone Encounter (Signed)
Patient called. Would like Shawn Ellison to call him. He has some questions about his medication. How to take it. His cb# [phone]

## 2023-06-23 ENCOUNTER — Encounter: Payer: Self-pay | Admitting: Orthopaedic Surgery

## 2023-06-23 ENCOUNTER — Other Ambulatory Visit (INDEPENDENT_AMBULATORY_CARE_PROVIDER_SITE_OTHER): Payer: Self-pay

## 2023-06-23 ENCOUNTER — Ambulatory Visit (INDEPENDENT_AMBULATORY_CARE_PROVIDER_SITE_OTHER): Payer: BC Managed Care – PPO | Admitting: Orthopaedic Surgery

## 2023-06-23 DIAGNOSIS — Z96641 Presence of right artificial hip joint: Secondary | ICD-10-CM

## 2023-06-23 NOTE — Progress Notes (Signed)
The patient is now around 6 weeks status post a right total hip arthroplasty.  He is 67 years old.  He has had slow progress postoperatively as a relates to pain which is impacting his mobility.  He has been having some tightness in the muscles around his right hip.  He had quite significant arthritis prior to surgery and a leg length difference.  His leg lengths are back to equal.  He is also having radicular symptoms going down that leg.  He had seen a spine specialist with [facility] in the past and actually is seeing my partner Dr. Christell Constant tomorrow.  The patient's son is a physical therapist and the patient is asked if dry needling would be appropriate and I told him I will be fine from my standpoint.  His gait is much improved from when I saw him preoperative.  He has minimal limp.  His leg lengths appear equal.  His right hip moves smoothly and fluidly with blocks to rotation and no significant stiffness at all.  An AP pelvis and lateral of the right hip shows a well-seated total hip arthroplasty with no complicating features.  His left hip joint space is well-maintained.  From a hip standpoint, I am pleased with how he is doing thus far.  I am fine with his physical therapist I am providing dry needling around the right hip as needed.  From my standpoint with his hip, I do not need to see him back for 3 months unless he is having any issues at all.

## 2023-06-24 ENCOUNTER — Ambulatory Visit: Payer: BC Managed Care – PPO | Admitting: Orthopedic Surgery

## 2023-06-24 ENCOUNTER — Other Ambulatory Visit (INDEPENDENT_AMBULATORY_CARE_PROVIDER_SITE_OTHER): Payer: BC Managed Care – PPO

## 2023-06-24 VITALS — BP 132/69 | HR 69 | Ht 69.0 in | Wt 200.0 lb

## 2023-06-24 DIAGNOSIS — G8929 Other chronic pain: Secondary | ICD-10-CM

## 2023-06-24 DIAGNOSIS — M5416 Radiculopathy, lumbar region: Secondary | ICD-10-CM

## 2023-06-24 DIAGNOSIS — M25551 Pain in right hip: Secondary | ICD-10-CM

## 2023-06-24 DIAGNOSIS — M545 Low back pain, unspecified: Secondary | ICD-10-CM

## 2023-06-24 DIAGNOSIS — Z96641 Presence of right artificial hip joint: Secondary | ICD-10-CM

## 2023-06-24 MED ORDER — PREGABALIN 75 MG PO CAPS: 75.0000 mg | ORAL_CAPSULE | Freq: Two times a day (BID) | ORAL | 0 refills | Status: DC

## 2023-06-24 NOTE — Progress Notes (Signed)
Orthopedic Spine Surgery Office Note  Assessment: Patient is a 67 y.o. male with right leg pain along the lateral aspect of the thigh and leg, suspect L5 radiculopathy   Plan: -Patient has tried PT, Tylenol, Celebrex, gabapentin, oral steroids, steroid injections -Recommended diagnostic/therapeutic L5 transforaminal injection -Discussed possible decompressive surgery at the L4/5 lateral recess and L5 foramen especially if he gets relief, even temporary, with the injection -Since gabapentin is not helping, told him to discontinue and try Lyrica which was prescribed in today -Patient should return to office in 4-5 weeks, x-rays at next visit: None   Patient expressed understanding of the plan and all questions were answered to the patient's satisfaction.   ___________________________________________________________________________   History:  Patient is a 67 y.o. male who presents today for lumbar spine.  Patient has had 1.5 years of right lower extremity pain.  He feels it in the right buttock, right lateral thigh, and right lateral leg.  He gets numbness and paresthesias in the same distribution.  There is no trauma or injury that preceded the onset.  He has no left lower extremity symptoms.  He is not having much back pain.  He recently underwent right THA with Dr. Magnus Ivan.  He has been doing well and recovering from that surgery.  He was previously seen and worked up at [facility] where he underwent numerous conservative treatments in order to try to help with this radiating leg pain.  Pain has been getting progressively worse with time.  He is currently taking Celebrex, gabapentin, and methocarbamol to control his pain.  Patient had a fall yesterday off of 1 step and landed on his right hip.  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   Weakness: Yes, right leg feels weaker.  No other weakness noted Symptoms of imbalance: Denies Paresthesias and numbness:  Yes, has paresthesias and numbness along the lateral aspect of the right thigh and right leg.  No other numbness or paresthesias. Bowel or bladder incontinence: Denies Saddle anesthesia: Denies  Treatments tried: PT, Tylenol, Celebrex, gabapentin, oral steroids, steroid injections  Review of systems: Denies fevers and chills, night sweats, unexplained weight loss.  Has had pain that wakes in the night.  Has a history of prostate cancer  Past medical history: HLD HTN Prostate cancer  Allergies: Lisinopril  Past surgical history:  Hernia repair Cholecystectomy Right THA Prostatectomy Lymphadenectomy of pelvis Parathyroidectomy  Social history: Denies use of nicotine product (smoking, vaping, patches, smokeless) Alcohol use: Yes, approximately 5 drinks per week Denies recreational drug use   Physical Exam:  BMI of 29.5  General: no acute distress, appears stated age Neurologic: alert, answering questions appropriately, following commands Respiratory: unlabored breathing on room air, symmetric chest rise Psychiatric: appropriate affect, normal cadence to speech   MSK (spine):  -Strength exam      Left  Right EHL    5/5  5/5 TA    5/5  5/5 GSC    5/5  5/5 Knee extension  5/5  5/5 Hip flexion   5/5  5/5  -Sensory exam    Sensation intact to light touch in L3-S1 nerve distributions of bilateral lower extremities  -Achilles DTR: 2/4 on the left, 2/4 on the right -Patellar tendon DTR: 2/4 on the left, 2/4 on the right  -Straight leg raise: Negative bilaterally -Femoral nerve stretch test: Negative bilaterally -Clonus: no beats bilaterally  -Left hip exam: No pain through range of motion -Right hip exam: No pain through range of motion,  negative Stinchfield, negative FABER  Imaging: XRs of the lumbar spine from 06/24/2023 was independently reviewed and interpreted, showing disc height loss with anterior osteophyte formation at L4/5 and L5/S1.  No other  significant degenerative changes seen.  Lumbarization of S1.  No evidence of instability on flexion/extension views.  No fracture or dislocation seen.  MRI of the lumbar spine from 11/11/2022 was independently reviewed and interpreted, showing DDD at L4/5 and L5/S1.  Modic changes along the L5/S1 endplates.  Foraminal stenosis on the right at L5/S1 and at L4/5.  Lateral recess stenosis on the right at L4/5 and L5/S1.  No other significant stenosis seen.   Patient name: Shawn Ellison Patient MRN: [id number] Date of visit: 06/24/23

## 2023-06-30 ENCOUNTER — Telehealth: Payer: Self-pay | Admitting: Physical Medicine and Rehabilitation

## 2023-06-30 NOTE — Telephone Encounter (Signed)
Patient called returning your call. CB#[phone]

## 2023-07-01 NOTE — Telephone Encounter (Signed)
Spoke with patient and scheduled injection for 07/08/23. Patient aware driver needed

## 2023-07-08 ENCOUNTER — Other Ambulatory Visit: Payer: Self-pay

## 2023-07-08 ENCOUNTER — Ambulatory Visit: Payer: BC Managed Care – PPO | Admitting: Physical Medicine and Rehabilitation

## 2023-07-08 VITALS — BP 136/82 | HR 59

## 2023-07-08 DIAGNOSIS — M5416 Radiculopathy, lumbar region: Secondary | ICD-10-CM

## 2023-07-08 MED ORDER — METHYLPREDNISOLONE ACETATE 40 MG/ML IJ SUSP
40.0000 mg | Freq: Once | INTRAMUSCULAR | Status: AC
Start: 2023-07-08 — End: 2023-07-08
  Administered 2023-07-08: 40 mg

## 2023-07-08 NOTE — Patient Instructions (Signed)

## 2023-07-08 NOTE — Progress Notes (Signed)
Functional Pain Scale - descriptive words and definitions  Distracting (5)    Aware of pain/able to complete some ADL's but limited by pain/sleep is affected and active distractions are only slightly useful. Moderate range order  Average Pain  varies   +Driver, -BT, -Dye Allergies.  Lower back pain on right side that radiates into the right leg. "Pins and needles" in the lower right leg

## 2023-07-25 NOTE — Procedures (Signed)
Lumbosacral Transforaminal Epidural Steroid Injection - Sub-Pedicular Approach with Fluoroscopic Guidance  Patient: Shawn Ellison      Date of Birth: Aug 14, 1956 MRN: [id number] PCP: Marguarite Arbour, MD      Visit Date: 07/08/2023   Universal Protocol:    Date/Time: 07/08/2023  Consent Given By: the patient  Position: PRONE  Additional Comments: Vital signs were monitored before and after the procedure. Patient was prepped and draped in the usual sterile fashion. The correct patient, procedure, and site was verified.   Injection Procedure Details:   Procedure diagnoses: Radiculopathy, lumbar region [M54.16]    Meds Administered:  Meds ordered this encounter  Medications   methylPREDNISolone acetate (DEPO-MEDROL) injection 40 mg    Laterality: Right  Location/Site: L5  Needle:5.0 in., 22 ga.  Short bevel or Quincke spinal needle  Needle Placement: Transforaminal  Findings:    -Comments: Excellent flow of contrast along the nerve, nerve root and into the epidural space.  Procedure Details: After squaring off the end-plates to get a true AP view, the C-arm was positioned so that an oblique view of the foramen as noted above was visualized. The target area is just inferior to the "nose of the scotty dog" or sub pedicular. The soft tissues overlying this structure were infiltrated with 2-3 ml. of 1% Lidocaine without Epinephrine.  The spinal needle was inserted toward the target using a "trajectory" view along the fluoroscope beam.  Under AP and lateral visualization, the needle was advanced so it did not puncture dura and was located close the 6 O'Clock position of the pedical in AP tracterory. Biplanar projections were used to confirm position. Aspiration was confirmed to be negative for CSF and/or blood. A 1-2 ml. volume of Isovue-250 was injected and flow of contrast was noted at each level. Radiographs were obtained for documentation purposes.   After attaining the  desired flow of contrast documented above, a 0.5 to 1.0 ml test dose of 0.25% Marcaine was injected into each respective transforaminal space.  The patient was observed for 90 seconds post injection.  After no sensory deficits were reported, and normal lower extremity motor function was noted,   the above injectate was administered so that equal amounts of the injectate were placed at each foramen (level) into the transforaminal epidural space.   Additional Comments:  No complications occurred Dressing: 2 x 2 sterile gauze and Band-Aid    Post-procedure details: Patient was observed during the procedure. Post-procedure instructions were reviewed.  Patient left the clinic in stable condition.

## 2023-07-25 NOTE — Progress Notes (Signed)
Shawn Ellison - 67 y.o. male MRN [id number]  Date of birth: 11-05-1955  Office Visit Note: Visit Date: 07/08/2023 PCP: Marguarite Arbour, MD Referred by: London Sheer, MD  Subjective: Chief Complaint  Patient presents with   Lower Back - Pain   HPI:  Shawn Ellison is a 67 y.o. male who comes in today at the request of Dr. Willia Craze for planned Right L5-S1 Lumbar Transforaminal epidural steroid injection with fluoroscopic guidance.  The patient has failed conservative care including home exercise, medications, time and activity modification.  This injection will be diagnostic and hopefully therapeutic.  Please see requesting physician notes for further details and justification.   ROS Otherwise per HPI.  Assessment & Plan: Visit Diagnoses:    ICD-10-CM   1. Radiculopathy, lumbar region  M54.16 XR C-ARM NO REPORT    Epidural Steroid injection    methylPREDNISolone acetate (DEPO-MEDROL) injection 40 mg      Plan: No additional findings.   Meds & Orders:  Meds ordered this encounter  Medications   methylPREDNISolone acetate (DEPO-MEDROL) injection 40 mg    Orders Placed This Encounter  Procedures   XR C-ARM NO REPORT   Epidural Steroid injection    Follow-up: Return for visit to requesting provider as needed.   Procedures: No procedures performed  Lumbosacral Transforaminal Epidural Steroid Injection - Sub-Pedicular Approach with Fluoroscopic Guidance  Patient: Shawn Ellison      Date of Birth: 1956-07-18 MRN: [id number] PCP: Marguarite Arbour, MD      Visit Date: 07/08/2023   Universal Protocol:    Date/Time: 07/08/2023  Consent Given By: the patient  Position: PRONE  Additional Comments: Vital signs were monitored before and after the procedure. Patient was prepped and draped in the usual sterile fashion. The correct patient, procedure, and site was verified.   Injection Procedure Details:   Procedure diagnoses: Radiculopathy, lumbar region  [M54.16]    Meds Administered:  Meds ordered this encounter  Medications   methylPREDNISolone acetate (DEPO-MEDROL) injection 40 mg    Laterality: Right  Location/Site: L5  Needle:5.0 in., 22 ga.  Short bevel or Quincke spinal needle  Needle Placement: Transforaminal  Findings:    -Comments: Excellent flow of contrast along the nerve, nerve root and into the epidural space.  Procedure Details: After squaring off the end-plates to get a true AP view, the C-arm was positioned so that an oblique view of the foramen as noted above was visualized. The target area is just inferior to the "nose of the scotty dog" or sub pedicular. The soft tissues overlying this structure were infiltrated with 2-3 ml. of 1% Lidocaine without Epinephrine.  The spinal needle was inserted toward the target using a "trajectory" view along the fluoroscope beam.  Under AP and lateral visualization, the needle was advanced so it did not puncture dura and was located close the 6 O'Clock position of the pedical in AP tracterory. Biplanar projections were used to confirm position. Aspiration was confirmed to be negative for CSF and/or blood. A 1-2 ml. volume of Isovue-250 was injected and flow of contrast was noted at each level. Radiographs were obtained for documentation purposes.   After attaining the desired flow of contrast documented above, a 0.5 to 1.0 ml test dose of 0.25% Marcaine was injected into each respective transforaminal space.  The patient was observed for 90 seconds post injection.  After no sensory deficits were reported, and normal lower extremity motor function was noted,   the above injectate was  administered so that equal amounts of the injectate were placed at each foramen (level) into the transforaminal epidural space.   Additional Comments:  No complications occurred Dressing: 2 x 2 sterile gauze and Band-Aid    Post-procedure details: Patient was observed during the  procedure. Post-procedure instructions were reviewed.  Patient left the clinic in stable condition.    Clinical History: CLINICAL DATA: Low back pain with sciatica for several years.  EXAM: MRI LUMBAR SPINE WITHOUT CONTRAST  TECHNIQUE: Multiplanar, multisequence MR imaging of the lumbar spine was performed. No intravenous contrast was administered.  COMPARISON: 08/06/2021  FINDINGS: Segmentation: Stable lumbar numbering with the lowest disc space numbered L5-S1.  Alignment: Physiologic.  Vertebrae: No fracture, evidence of discitis, or bone lesion.  Conus medullaris and cauda equina: Conus extends to the T12 level. Conus and cauda equina appear normal.  Paraspinal and other soft tissues: Negative for perispinal mass or inflammation.  Disc levels:  T12- L1: Unremarkable.  L1-L2: Unremarkable.  L2-L3: Mild degenerative facet spurring.  L3-L4: Disc space narrowing and bulging with endplate and facet spurring. Small bilateral paracentral herniations may be present. Mild to moderate narrowing of the thecal sac which is similar to prior.  L4-L5: Disc narrowing with endplate degeneration and ridging eccentric to the right where there is also greater facet spurring. Mild right foraminal narrowing. Right more than left subarticular recess crowding, herniation appearance at the right subarticular recess has regressed on axial images.  L5-S1:Unremarkable.  IMPRESSION: 1. 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 2. L3-4 mild to moderate spinal stenosis. 3. L4-5 right more than left subarticular recess crowding and mild right foraminal stenosis.   Electronically Signed By: Tiburcio Pea M.D. On: 11/12/2022 10:13     Objective:  VS:  HT:    WT:   BMI:     BP:136/82  HR:(!) 59bpm  TEMP: ( )  RESP:  Physical Exam Vitals and nursing note reviewed.  Constitutional:      General: He is not in acute  distress.    Appearance: Normal appearance. He is not ill-appearing.  HENT:     Head: Normocephalic and atraumatic.     Right Ear: External ear normal.     Left Ear: External ear normal.     Nose: No congestion.  Eyes:     Extraocular Movements: Extraocular movements intact.  Cardiovascular:     Rate and Rhythm: Normal rate.     Pulses: Normal pulses.  Pulmonary:     Effort: Pulmonary effort is normal. No respiratory distress.  Abdominal:     General: There is no distension.     Palpations: Abdomen is soft.  Musculoskeletal:        General: No tenderness or signs of injury.     Cervical back: Neck supple.     Right lower leg: No edema.     Left lower leg: No edema.     Comments: Patient has good distal strength without clonus.  Skin:    Findings: No erythema or rash.  Neurological:     General: No focal deficit present.     Mental Status: He is alert and oriented to person, place, and time.     Sensory: No sensory deficit.     Motor: No weakness or abnormal muscle tone.     Coordination: Coordination normal.  Psychiatric:        Mood and Affect: Mood normal.        Behavior: Behavior normal.  Imaging: No results found.

## 2023-07-29 ENCOUNTER — Ambulatory Visit: Payer: BC Managed Care – PPO | Admitting: Dermatology

## 2023-07-29 ENCOUNTER — Ambulatory Visit: Payer: BC Managed Care – PPO | Admitting: Orthopedic Surgery

## 2023-07-29 DIAGNOSIS — W908XXA Exposure to other nonionizing radiation, initial encounter: Secondary | ICD-10-CM | POA: Diagnosis not present

## 2023-07-29 DIAGNOSIS — L814 Other melanin hyperpigmentation: Secondary | ICD-10-CM | POA: Diagnosis not present

## 2023-07-29 DIAGNOSIS — L820 Inflamed seborrheic keratosis: Secondary | ICD-10-CM | POA: Diagnosis not present

## 2023-07-29 DIAGNOSIS — L918 Other hypertrophic disorders of the skin: Secondary | ICD-10-CM

## 2023-07-29 DIAGNOSIS — D229 Melanocytic nevi, unspecified: Secondary | ICD-10-CM

## 2023-07-29 DIAGNOSIS — Z808 Family history of malignant neoplasm of other organs or systems: Secondary | ICD-10-CM

## 2023-07-29 DIAGNOSIS — M5416 Radiculopathy, lumbar region: Secondary | ICD-10-CM | POA: Diagnosis not present

## 2023-07-29 DIAGNOSIS — D224 Melanocytic nevi of scalp and neck: Secondary | ICD-10-CM

## 2023-07-29 DIAGNOSIS — L578 Other skin changes due to chronic exposure to nonionizing radiation: Secondary | ICD-10-CM | POA: Diagnosis not present

## 2023-07-29 DIAGNOSIS — Z1283 Encounter for screening for malignant neoplasm of skin: Secondary | ICD-10-CM | POA: Diagnosis not present

## 2023-07-29 DIAGNOSIS — L821 Other seborrheic keratosis: Secondary | ICD-10-CM

## 2023-07-29 NOTE — Progress Notes (Signed)
   Follow-Up Visit   Subjective  Shawn Ellison is a 67 y.o. male who presents for the following: Skin Cancer Screening and Full Body Skin Exam  The patient presents for Total-Body Skin Exam (TBSE) for skin cancer screening and mole check. The patient has spots, moles and lesions to be evaluated, some may be new or changing and the patient may have concern these could be cancer.  The following portions of the chart were reviewed this encounter and updated as appropriate: medications, allergies, medical history  Review of Systems:  No other skin or systemic complaints except as noted in HPI or Assessment and Plan.  Objective  Well appearing patient in no apparent distress; mood and affect are within normal limits.  A full examination was performed including scalp, head, eyes, ears, nose, lips, neck, chest, axillae, abdomen, back, buttocks, bilateral upper extremities, bilateral lower extremities, hands, feet, fingers, toes, fingernails, and toenails. All findings within normal limits unless otherwise noted below.   Relevant physical exam findings are noted in the Assessment and Plan.  R med tricep x 1, R dorsum hand x 1 (2) Erythematous stuck-on, waxy papule or plaque    Assessment & Plan   SKIN CANCER SCREENING PERFORMED TODAY.  ACTINIC DAMAGE - Chronic condition, secondary to cumulative UV/sun exposure - diffuse scaly erythematous macules with underlying dyspigmentation - Recommend daily broad spectrum sunscreen SPF 30+ to sun-exposed areas, reapply every 2 hours as needed.  - Staying in the shade or wearing long sleeves, sun glasses (UVA+UVB protection) and wide brim hats (4-inch brim around the entire circumference of the hat) are also recommended for sun protection.  - Call for new or changing lesions.  LENTIGINES, SEBORRHEIC KERATOSES, HEMANGIOMAS - Benign normal skin lesions - Benign-appearing - Call for any changes  MELANOCYTIC NEVI - R crown scalp flesh colored papule -  Tan-brown and/or pink-flesh-colored symmetric macules and papules - Benign appearing on exam today - Observation - Call clinic for new or changing moles - Recommend daily use of broad spectrum spf 30+ sunscreen to sun-exposed areas.   FAMILY HISTORY OF SKIN CANCER What type(s):Unknown Who affected:Mother  Inflamed seborrheic keratosis (2) R med tricep x 1, R dorsum hand x 1  Symptomatic, irritating, patient would like treated.   Destruction of lesion - R med tricep x 1, R dorsum hand x 1 (2) Complexity: simple   Destruction method: cryotherapy   Informed consent: discussed and consent obtained   Timeout:  patient name, date of birth, surgical site, and procedure verified Lesion destroyed using liquid nitrogen: Yes   Region frozen until ice ball extended beyond lesion: Yes   Outcome: patient tolerated procedure well with no complications   Post-procedure details: wound care instructions given    Acrochordons (Skin Tags) - Fleshy, skin-colored pedunculated papules - Benign appearing.  - Observe. - If desired, they can be removed with an in office procedure that is not covered by insurance. - Please call the clinic if you notice any new or changing lesions.  Return in about 1 year (around 07/28/2024) for TBSE.  Maylene Roes, CMA, am acting as scribe for Armida Sans, MD .   Documentation: I have reviewed the above documentation for accuracy and completeness, and I agree with the above.  Armida Sans, MD

## 2023-07-29 NOTE — Progress Notes (Signed)
Orthopedic Spine Surgery Office Note   Assessment: Patient is a 67 y.o. male with right leg pain along the lateral aspect of the thigh and leg that has improved with an injection     Plan: -Patient has tried PT, Tylenol, Celebrex, gabapentin, oral steroids, steroid injections, lyrica  -Since he got such great relief with the transforaminal injection, he was interested in another.  New referral was provided to him today -He is going to discontinue the Celebrex and Lyrica -Patient should return to office in 8 weeks, x-rays at next visit: None     Patient expressed understanding of the plan and all questions were answered to the patient's satisfaction.    ___________________________________________________________________________     History:   Patient is a 67 y.o. male who presents today for follow up on his lumbar spine.  After last visit, patient got a L5 transforaminal injection with Dr. Alvester Morin.  He states that he has no significant benefit with that injection.  He feels he got about 80% relief.  He still has some pain in the lateral aspect of the leg on the right side.  Still not having any left lower extremity symptoms.  He has not developed any new symptoms since he was last seen.  Treatments tried: PT, Tylenol, Celebrex, gabapentin, oral steroids, steroid injections, lyrica    Physical Exam:   General: no acute distress, appears stated age Neurologic: alert, answering questions appropriately, following commands Respiratory: unlabored breathing on room air, symmetric chest rise Psychiatric: appropriate affect, normal cadence to speech     MSK (spine):   -Strength exam                                                   Left                  Right EHL                              5/5                  5/5 TA                                 5/5                  5/5 GSC                             5/5                  5/5 Knee extension            5/5                  5/5 Hip  flexion                    5/5                  5/5   -Sensory exam                           Sensation intact to light touch in  L3-S1 nerve distributions of bilateral lower extremities   -Achilles DTR: 2/4 on the left, 2/4 on the right -Patellar tendon DTR: 2/4 on the left, 2/4 on the right   -Straight leg raise: Negative bilaterally -Femoral nerve stretch test: Negative bilaterally -Clonus: no beats bilaterally   Imaging: XRs of the lumbar spine from 06/24/2023 were previously independently reviewed and interpreted, showing disc height loss with anterior osteophyte formation at L4/5 and L5/S1.  No other significant degenerative changes seen.  Lumbarization of S1.  No evidence of instability on flexion/extension views.  No fracture or dislocation seen.   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  Modic changes along the L5/S1 endplates.  Foraminal stenosis on the right at L5/S1 and at L4/5.  Lateral recess stenosis on the right at L4/5 and L5/S1.  No other significant stenosis seen.     Patient name: Shawn Ellison Patient MRN: [id number] Date of visit: 07/29/23

## 2023-07-29 NOTE — Patient Instructions (Signed)

## 2023-08-08 ENCOUNTER — Encounter: Payer: Self-pay | Admitting: Dermatology

## 2023-09-07 ENCOUNTER — Telehealth: Payer: Self-pay | Admitting: Physical Medicine and Rehabilitation

## 2023-09-27 ENCOUNTER — Ambulatory Visit (INDEPENDENT_AMBULATORY_CARE_PROVIDER_SITE_OTHER): Payer: BC Managed Care – PPO | Admitting: Orthopaedic Surgery

## 2023-09-27 ENCOUNTER — Encounter: Payer: Self-pay | Admitting: Orthopaedic Surgery

## 2023-09-27 ENCOUNTER — Ambulatory Visit: Payer: BC Managed Care – PPO | Admitting: Orthopedic Surgery

## 2023-09-27 DIAGNOSIS — Z96641 Presence of right artificial hip joint: Secondary | ICD-10-CM | POA: Diagnosis not present

## 2023-09-27 NOTE — Progress Notes (Signed)
The patient is now 4 and half months status post a right total hip arthroplasty.  He is 67 years old.  He said he is doing very well with the right hip.  He also sees Dr. Christell Constant and Dr. Alvester Morin and has had an epidural transforaminal type of injection in his lumbar spine which helped and he is actually being set up for another injection.  He says his right hip is doing well.  He did fall a few months ago and had a lot of bruising but he landed on his left side and has had no issues with his right hip at all.  He reports good range of motion and no pain in the groin.  On examination of his right hip and is smoothly and fluidly.  There is no blocks to rotation and no pain.  He moves a smooth his native left hip.  At this point he will continue to increase his activities from my standpoint as comfort allows.  I will see him back in 6 months for the standing AP pelvis and lateral of his right operative hip.  If there is issues before then with the hip he knows to let us know.

## 2023-10-04 ENCOUNTER — Ambulatory Visit: Payer: BC Managed Care – PPO | Admitting: Physical Medicine and Rehabilitation

## 2023-10-04 ENCOUNTER — Encounter: Payer: BC Managed Care – PPO | Admitting: Physical Medicine and Rehabilitation

## 2023-10-04 ENCOUNTER — Other Ambulatory Visit: Payer: Self-pay

## 2023-10-04 VITALS — BP 151/93 | HR 60

## 2023-10-04 DIAGNOSIS — M5416 Radiculopathy, lumbar region: Secondary | ICD-10-CM

## 2023-10-04 MED ORDER — METHYLPREDNISOLONE ACETATE 40 MG/ML IJ SUSP
40.0000 mg | Freq: Once | INTRAMUSCULAR | Status: AC
Start: 2023-10-04 — End: 2023-10-04
  Administered 2023-10-04: 40 mg

## 2023-10-04 NOTE — Progress Notes (Signed)
 Shawn Ellison - 68 y.o. male MRN [id number]  Date of birth: Dec 09, 1955  Office Visit Note: Visit Date: 10/04/2023 PCP: Auston Reyes BIRCH, MD Referred by: Georgina Ozell LABOR, MD  Subjective: Chief Complaint  Patient presents with   Lower Back - Pain   Right Hip - Pain   HPI:  Shawn Ellison is a 68 y.o. male who comes in today for planned repeat Right L5-S1  Lumbar Transforaminal epidural steroid injection with fluoroscopic guidance.  The patient has failed conservative care including home exercise, medications, time and activity modification.  This injection will be diagnostic and hopefully therapeutic.  Please see requesting physician notes for further details and justification. Patient received more than 50% pain relief from prior injection.   Referring: Dr. Ozell Georgina   ROS Otherwise per HPI.  Assessment & Plan: Visit Diagnoses:    ICD-10-CM   1. Lumbar radiculopathy  M54.16 methylPREDNISolone  acetate (DEPO-MEDROL ) injection 40 mg    XR C-ARM NO REPORT    Epidural Steroid injection      Plan: No additional findings.   Meds & Orders:  Meds ordered this encounter  Medications   methylPREDNISolone  acetate (DEPO-MEDROL ) injection 40 mg    Orders Placed This Encounter  Procedures   XR C-ARM NO REPORT   Epidural Steroid injection    Follow-up: Return for visit to requesting provider as needed.   Procedures: No procedures performed  Lumbosacral Transforaminal Epidural Steroid Injection - Sub-Pedicular Approach with Fluoroscopic Guidance  Patient: Shawn Ellison      Date of Birth: 09-20-1956 MRN: [id number] PCP: Auston Reyes BIRCH, MD      Visit Date: 10/04/2023   Universal Protocol:    Date/Time: 10/04/2023  Consent Given By: the patient  Position: PRONE  Additional Comments: Vital signs were monitored before and after the procedure. Patient was prepped and draped in the usual sterile fashion. The correct patient, procedure, and site was  verified.   Injection Procedure Details:   Procedure diagnoses: Lumbar radiculopathy [M54.16]    Meds Administered:  Meds ordered this encounter  Medications   methylPREDNISolone  acetate (DEPO-MEDROL ) injection 40 mg    Laterality: Right  Location/Site: L5  Needle:5.0 in., 22 ga.  Short bevel or Quincke spinal needle  Needle Placement: Transforaminal  Findings:    -Comments: Excellent flow of contrast along the nerve, nerve root and into the epidural space.  Procedure Details: After squaring off the end-plates to get a true AP view, the C-arm was positioned so that an oblique view of the foramen as noted above was visualized. The target area is just inferior to the nose of the scotty dog or sub pedicular. The soft tissues overlying this structure were infiltrated with 2-3 ml. of 1% Lidocaine  without Epinephrine .  The spinal needle was inserted toward the target using a trajectory view along the fluoroscope beam.  Under AP and lateral visualization, the needle was advanced so it did not puncture dura and was located close the 6 O'Clock position of the pedical in AP tracterory. Biplanar projections were used to confirm position. Aspiration was confirmed to be negative for CSF and/or blood. A 1-2 ml. volume of Isovue-250 was injected and flow of contrast was noted at each level. Radiographs were obtained for documentation purposes.   After attaining the desired flow of contrast documented above, a 0.5 to 1.0 ml test dose of 0.25% Marcaine  was injected into each respective transforaminal space.  The patient was observed for 90 seconds post injection.  After no sensory deficits were  reported, and normal lower extremity motor function was noted,   the above injectate was administered so that equal amounts of the injectate were placed at each foramen (level) into the transforaminal epidural space.   Additional Comments:  The patient tolerated the procedure well Dressing: 2 x 2 sterile  gauze and Band-Aid    Post-procedure details: Patient was observed during the procedure. Post-procedure instructions were reviewed.  Patient left the clinic in stable condition.    Clinical History: CLINICAL DATA: Low back pain with sciatica for several years.  EXAM: MRI LUMBAR SPINE WITHOUT CONTRAST  TECHNIQUE: Multiplanar, multisequence MR imaging of the lumbar spine was performed. No intravenous contrast was administered.  COMPARISON: 08/06/2021  FINDINGS: Segmentation: Stable lumbar numbering with the lowest disc space numbered L5-S1.  Alignment: Physiologic.  Vertebrae: No fracture, evidence of discitis, or bone lesion.  Conus medullaris and cauda equina: Conus extends to the T12 level. Conus and cauda equina appear normal.  Paraspinal and other soft tissues: Negative for perispinal mass or inflammation.  Disc levels:  T12- L1: Unremarkable.  L1-L2: Unremarkable.  L2-L3: Mild degenerative facet spurring.  L3-L4: Disc space narrowing and bulging with endplate and facet spurring. Small bilateral paracentral herniations may be present. Mild to moderate narrowing of the thecal sac which is similar to prior.  L4-L5: Disc narrowing with endplate degeneration and ridging eccentric to the right where there is also greater facet spurring. Mild right foraminal narrowing. Right more than left subarticular recess crowding, herniation appearance at the right subarticular recess has regressed on axial images.  L5-S1:Unremarkable.  IMPRESSION: 1. 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 2. L3-4 mild to moderate spinal stenosis. 3. L4-5 right more than left subarticular recess crowding and mild right foraminal stenosis.   Electronically Signed By: Dorn Roulette M.D. On: 11/12/2022 10:13     Objective:  VS:  HT:    WT:   BMI:     BP:(!) 151/93  HR:60bpm  TEMP: ( )  RESP:  Physical Exam Vitals  and nursing note reviewed.  Constitutional:      General: He is not in acute distress.    Appearance: Normal appearance. He is not ill-appearing.  HENT:     Head: Normocephalic and atraumatic.     Right Ear: External ear normal.     Left Ear: External ear normal.     Nose: No congestion.  Eyes:     Extraocular Movements: Extraocular movements intact.  Cardiovascular:     Rate and Rhythm: Normal rate.     Pulses: Normal pulses.  Pulmonary:     Effort: Pulmonary effort is normal. No respiratory distress.  Abdominal:     General: There is no distension.     Palpations: Abdomen is soft.  Musculoskeletal:        General: No tenderness or signs of injury.     Cervical back: Neck supple.     Right lower leg: No edema.     Left lower leg: No edema.     Comments: Patient has good distal strength without clonus.  Skin:    Findings: No erythema or rash.  Neurological:     General: No focal deficit present.     Mental Status: He is alert and oriented to person, place, and time.     Sensory: No sensory deficit.     Motor: No weakness or abnormal muscle tone.     Coordination: Coordination normal.  Psychiatric:  Mood and Affect: Mood normal.        Behavior: Behavior normal.      Imaging: No results found.

## 2023-10-04 NOTE — Procedures (Signed)
 Lumbosacral Transforaminal Epidural Steroid Injection - Sub-Pedicular Approach with Fluoroscopic Guidance  Patient: Shawn Ellison      Date of Birth: [birth year] MRN: [id number] PCP: Auston Reyes BIRCH, MD      Visit Date: 10/04/2023   Universal Protocol:    Date/Time: 10/04/2023  Consent Given By: the patient  Position: PRONE  Additional Comments: Vital signs were monitored before and after the procedure. Patient was prepped and draped in the usual sterile fashion. The correct patient, procedure, and site was verified.   Injection Procedure Details:   Procedure diagnoses: Lumbar radiculopathy [M54.16]    Meds Administered:  Meds ordered this encounter  Medications   methylPREDNISolone  acetate (DEPO-MEDROL ) injection 40 mg    Laterality: Right  Location/Site: L5  Needle:5.0 in., 22 ga.  Short bevel or Quincke spinal needle  Needle Placement: Transforaminal  Findings:    -Comments: Excellent flow of contrast along the nerve, nerve root and into the epidural space.  Procedure Details: After squaring off the end-plates to get a true AP view, the C-arm was positioned so that an oblique view of the foramen as noted above was visualized. The target area is just inferior to the nose of the scotty dog or sub pedicular. The soft tissues overlying this structure were infiltrated with 2-3 ml. of 1% Lidocaine  without Epinephrine .  The spinal needle was inserted toward the target using a trajectory view along the fluoroscope beam.  Under AP and lateral visualization, the needle was advanced so it did not puncture dura and was located close the 6 O'Clock position of the pedical in AP tracterory. Biplanar projections were used to confirm position. Aspiration was confirmed to be negative for CSF and/or blood. A 1-2 ml. volume of Isovue-250 was injected and flow of contrast was noted at each level. Radiographs were obtained for documentation purposes.   After attaining the desired flow  of contrast documented above, a 0.5 to 1.0 ml test dose of 0.25% Marcaine  was injected into each respective transforaminal space.  The patient was observed for 90 seconds post injection.  After no sensory deficits were reported, and normal lower extremity motor function was noted,   the above injectate was administered so that equal amounts of the injectate were placed at each foramen (level) into the transforaminal epidural space.   Additional Comments:  The patient tolerated the procedure well Dressing: 2 x 2 sterile gauze and Band-Aid    Post-procedure details: Patient was observed during the procedure. Post-procedure instructions were reviewed.  Patient left the clinic in stable condition.

## 2023-10-04 NOTE — Progress Notes (Signed)
 Functional Pain Scale - descriptive words and definitions  Distressing (6)    Pain is present/unable to complete most ADLs limited by pain/sleep is difficult and active distraction is only marginal. Moderate range order  Average Pain 6   +Driver, -BT, -Dye Allergies.  Here for injection, pain mainly in right hip

## 2023-10-07 ENCOUNTER — Other Ambulatory Visit: Payer: Self-pay | Admitting: Medical Genetics

## 2023-10-08 ENCOUNTER — Other Ambulatory Visit
Admission: RE | Admit: 2023-10-08 | Discharge: 2023-10-08 | Disposition: A | Discharge status: Home / Self Care | Payer: Self-pay | Source: Ambulatory Visit | Attending: Medical Genetics | Admitting: Medical Genetics

## 2023-10-11 ENCOUNTER — Encounter: Payer: BC Managed Care – PPO | Admitting: Physical Medicine and Rehabilitation

## 2023-10-13 ENCOUNTER — Ambulatory Visit: Payer: BC Managed Care – PPO | Admitting: Orthopedic Surgery

## 2023-10-19 LAB — GENECONNECT MOLECULAR SCREEN: Genetic Analysis Overall Interpretation: NEGATIVE

## 2023-11-03 ENCOUNTER — Ambulatory Visit: Payer: BC Managed Care – PPO | Admitting: Orthopedic Surgery

## 2023-11-03 DIAGNOSIS — M5416 Radiculopathy, lumbar region: Secondary | ICD-10-CM

## 2023-11-03 NOTE — Progress Notes (Signed)
 Orthopedic Spine Surgery Office Note   Assessment: Patient is a 68 y.o. male with right leg pain along the lateral aspect of the thigh and leg.  Has right-sided foraminal stenosis at L5/S1 that is previously responded to injections     Plan: -Patient has tried PT, Tylenol , Celebrex , gabapentin , oral steroids, steroid injections, lyrica   -Since he is doing quite well with injections, we will continue with that treatment plan -He does not like taking any medication, so will hold off on further prescriptions -Patient should return to office in 2 months, x-rays at next visit: AP/lateral/flex/ex lumbar     Patient expressed understanding of the plan and all questions were answered to the patient's satisfaction.    ___________________________________________________________________________     History:   Patient is a 68 y.o. male who presents today for follow up on his lumbar spine.  Patient states he has been doing well for the last 6 months or so.  He was in significant pain that was limiting his daily activities including simple things like watching a grandkids sporting event, but that is no longer the case.  He said he was in that amount of pain about a year ago.  His pain is in his right lower extremity.  He often feels it along the lateral aspect of the thigh.  However, the most consistent pain is in the posterior calf.  He has been getting injections to the right L5 nerve root with Dr. Eldonna.  He gets significant relief with those injections.  He said he gets about 80% relief with time.  He is able to do much more activity as result of the improved pain relief with those injections.  He has not noticed any new symptoms since he was last seen in the office.   Treatments tried: PT, Tylenol , Celebrex , gabapentin , oral steroids, steroid injections, lyrica      Physical Exam:   General: no acute distress, appears stated age Neurologic: alert, answering questions appropriately, following  commands Respiratory: unlabored breathing on room air, symmetric chest rise Psychiatric: appropriate affect, normal cadence to speech     MSK (spine):   -Strength exam                                                   Left                  Right EHL                              5/5                  5/5 TA                                 5/5                  5/5 GSC                             5/5                  5/5 Knee extension            5/5  5/5 Hip flexion                    5/5                  5/5   -Sensory exam                           Sensation intact to light touch in L3-S1 nerve distributions of bilateral lower extremities   -Achilles DTR: 2/4 on the left, 2/4 on the right -Patellar tendon DTR: 2/4 on the left, 2/4 on the right   Imaging: XRs of the lumbar spine from 06/24/2023 were previously independently reviewed and interpreted, showing disc height loss with anterior osteophyte formation at L4/5 and L5/S1.  No other significant degenerative changes seen.  Lumbarization of S1.  No evidence of instability on flexion/extension views.  No fracture or dislocation seen.   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  Modic changes along the L5/S1 endplates.  Foraminal stenosis on the right at L5/S1 and at L4/5.  Lateral recess stenosis on the right at L4/5 and L5/S1.  No other significant stenosis seen.     Patient name: Shawn Ellison Patient MRN: [id number] Date of visit: 11/03/23

## 2024-01-05 ENCOUNTER — Ambulatory Visit: Payer: BC Managed Care – PPO | Admitting: Orthopedic Surgery

## 2024-01-06 ENCOUNTER — Ambulatory Visit: Payer: BC Managed Care – PPO | Admitting: Orthopedic Surgery

## 2024-01-06 ENCOUNTER — Other Ambulatory Visit (INDEPENDENT_AMBULATORY_CARE_PROVIDER_SITE_OTHER): Payer: Self-pay

## 2024-01-06 DIAGNOSIS — M5416 Radiculopathy, lumbar region: Secondary | ICD-10-CM

## 2024-01-06 MED ORDER — MELOXICAM 15 MG PO TABS: 15.0000 mg | ORAL_TABLET | Freq: Every day | ORAL | 2 refills | Status: DC

## 2024-01-06 NOTE — Progress Notes (Signed)
 Orthopedic Spine Surgery Office Note   Assessment: Patient is a 68 y.o. male with right leg pain along the lateral aspect of the thigh and leg.  Has right-sided foraminal stenosis at L5/S1 causing radiculopathy     Plan: -Patient has tried PT, Tylenol, Celebrex, gabapentin, oral steroids, steroid injections, lyrica  -He continues to do well with injections. He's getting about 4 months of relief out of them so we will continue with that treatment plan for now -Has been getting good relief with meloxicam so new prescription provided to him today -Patient should return to office in 6 months, x-rays at next visit: AP/lateral/flex/ex lumbar     Patient expressed understanding of the plan and all questions were answered to the patient's satisfaction.    ___________________________________________________________________________     History:   Patient is a 68 y.o. male who presents today for follow up on his lumbar spine.  Patient has had pain radiating into his right lower extremity.  He feels along the lateral aspect of the thigh and often in the posterior calf.  Dr. Alvester Morin has been treating him with lumbar steroid injections.  He has noticed significant relief with those injections.  Those injections allow him to do the activities that he would like such as going to his grandkids sporting events.  He has also been using meloxicam and has found that helpful.  He has not developed any new symptoms since he was last seen in the office.  As worst, he rates the pain in his leg as a 7 out of 10.   Treatments tried: PT, Tylenol, Celebrex, gabapentin, oral steroids, steroid injections, lyrica     Physical Exam:   General: no acute distress, appears stated age Neurologic: alert, answering questions appropriately, following commands Respiratory: unlabored breathing on room air, symmetric chest rise Psychiatric: appropriate affect, normal cadence to speech     MSK (spine):   -Strength exam                                                    Left                  Right EHL                              5/5                  5/5 TA                                 5/5                  5/5 GSC                             5/5                  5/5 Knee extension            5/5                  5/5 Hip flexion  5/5                  5/5   -Sensory exam                           Sensation intact to light touch in L3-S1 nerve distributions of bilateral lower extremities    Imaging: XRs of the lumbar spine from 01/06/2024 were independently reviewed and interpreted, showing disc height loss at L4/5 and L5/S1.  Anterior osteophyte formation noted at L4/5 and L5/S1.  Lumbarization of S1.  No evidence of instability on flexion/extension views.  No fracture or dislocation seen.   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  Modic changes along the L5/S1 endplates.  Foraminal stenosis on the right at L5/S1 and at L4/5.  Lateral recess stenosis on the right at L4/5 and L5/S1.  No other significant stenosis seen.     Patient name: Shawn Ellison Patient MRN: [id number] Date of visit: 01/06/24

## 2024-02-24 ENCOUNTER — Telehealth: Payer: Self-pay | Admitting: Physical Medicine and Rehabilitation

## 2024-02-24 NOTE — Telephone Encounter (Signed)
 Patient called and needs to change the date to see [facility]. CB#[phone]

## 2024-02-28 ENCOUNTER — Telehealth: Payer: Self-pay | Admitting: Physical Medicine and Rehabilitation

## 2024-02-28 ENCOUNTER — Encounter: Admitting: Physical Medicine and Rehabilitation

## 2024-02-28 NOTE — Telephone Encounter (Signed)
 Pt requesting an appt for back pain

## 2024-03-22 ENCOUNTER — Other Ambulatory Visit: Payer: Self-pay

## 2024-03-22 ENCOUNTER — Ambulatory Visit: Admitting: Physical Medicine and Rehabilitation

## 2024-03-22 VITALS — BP 157/95 | HR 71

## 2024-03-22 DIAGNOSIS — M5416 Radiculopathy, lumbar region: Secondary | ICD-10-CM | POA: Diagnosis not present

## 2024-03-22 MED ORDER — METHYLPREDNISOLONE ACETATE 40 MG/ML IJ SUSP
40.0000 mg | Freq: Once | INTRAMUSCULAR | Status: AC
Start: 2024-03-22 — End: 2024-03-22
  Administered 2024-03-22: 40 mg

## 2024-03-22 NOTE — Progress Notes (Signed)
 Pain Scale   Average Pain 4 Patient  states he has chronic lower back pain radiating to right leg at times. Patient advising that his pain comes and goes with warning. Patient did advise that at times sitting increases his discomfort and walking does help the pain        +Driver, -BT, -Dye Allergies.

## 2024-03-22 NOTE — Procedures (Signed)
 Lumbosacral Transforaminal Epidural Steroid Injection - Sub-Pedicular Approach with Fluoroscopic Guidance  Patient: Shawn Ellison      Date of Birth: [birth year] MRN: [id number] PCP: Auston Reyes BIRCH, MD      Visit Date: 03/22/2024   Universal Protocol:    Date/Time: 03/22/2024  Consent Given By: the patient  Position: PRONE  Additional Comments: Vital signs were monitored before and after the procedure. Patient was prepped and draped in the usual sterile fashion. The correct patient, procedure, and site was verified.   Injection Procedure Details:   Procedure diagnoses: Radiculopathy, lumbar region [M54.16]    Meds Administered:  Meds ordered this encounter  Medications   methylPREDNISolone  acetate (DEPO-MEDROL ) injection 40 mg    Laterality: Right  Location/Site: L5  Needle:5.0 in., 22 ga.  Short bevel or Quincke spinal needle  Needle Placement: Transforaminal  Findings:    -Comments: Excellent flow of contrast along the nerve, nerve root and into the epidural space.  Procedure Details: After squaring off the end-plates to get a true AP view, the C-arm was positioned so that an oblique view of the foramen as noted above was visualized. The target area is just inferior to the nose of the scotty dog or sub pedicular. The soft tissues overlying this structure were infiltrated with 2-3 ml. of 1% Lidocaine  without Epinephrine .  The spinal needle was inserted toward the target using a trajectory view along the fluoroscope beam.  Under AP and lateral visualization, the needle was advanced so it did not puncture dura and was located close the 6 O'Clock position of the pedical in AP tracterory. Biplanar projections were used to confirm position. Aspiration was confirmed to be negative for CSF and/or blood. A 1-2 ml. volume of Isovue-250 was injected and flow of contrast was noted at each level. Radiographs were obtained for documentation purposes.   After attaining the  desired flow of contrast documented above, a 0.5 to 1.0 ml test dose of 0.25% Marcaine  was injected into each respective transforaminal space.  The patient was observed for 90 seconds post injection.  After no sensory deficits were reported, and normal lower extremity motor function was noted,   the above injectate was administered so that equal amounts of the injectate were placed at each foramen (level) into the transforaminal epidural space.   Additional Comments:  The patient tolerated the procedure well Dressing: 2 x 2 sterile gauze and Band-Aid    Post-procedure details: Patient was observed during the procedure. Post-procedure instructions were reviewed.  Patient left the clinic in stable condition.

## 2024-03-22 NOTE — Patient Instructions (Signed)

## 2024-03-22 NOTE — Progress Notes (Signed)
 Avon Mergenthaler - 68 y.o. male MRN [id number]  Date of birth: 05-12-1956  Office Visit Note: Visit Date: 03/22/2024 PCP: Auston Reyes BIRCH, MD Referred by: Auston Reyes BIRCH, MD  Subjective: Chief Complaint  Patient presents with   Lower Back - Pain   HPI:  Jarmaine Ehrler is a 68 y.o. male who comes in today for planned repeat Right L5-S1  Lumbar Transforaminal epidural steroid injection with fluoroscopic guidance.  The patient has failed conservative care including home exercise, medications, time and activity modification.  This injection will be diagnostic and hopefully therapeutic.  Please see requesting physician notes for further details and justification. Patient received more than 50% pain relief from prior injection. Can always consider interlaminar approach but doing well.  Referring: Dr. Ozell Ada   ROS Otherwise per HPI.  Assessment & Plan: Visit Diagnoses:    ICD-10-CM   1. Radiculopathy, lumbar region  M54.16 XR C-ARM NO REPORT    Epidural Steroid injection    methylPREDNISolone  acetate (DEPO-MEDROL ) injection 40 mg      Plan: No additional findings.   Meds & Orders:  Meds ordered this encounter  Medications   methylPREDNISolone  acetate (DEPO-MEDROL ) injection 40 mg    Orders Placed This Encounter  Procedures   XR C-ARM NO REPORT   Epidural Steroid injection    Follow-up: Return for visit to requesting provider as needed.   Procedures: No procedures performed  Lumbosacral Transforaminal Epidural Steroid Injection - Sub-Pedicular Approach with Fluoroscopic Guidance  Patient: Gaither Biehn      Date of Birth: December 18, 1955 MRN: [id number] PCP: Auston Reyes BIRCH, MD      Visit Date: 03/22/2024   Universal Protocol:    Date/Time: 03/22/2024  Consent Given By: the patient  Position: PRONE  Additional Comments: Vital signs were monitored before and after the procedure. Patient was prepped and draped in the usual sterile fashion. The correct patient,  procedure, and site was verified.   Injection Procedure Details:   Procedure diagnoses: Radiculopathy, lumbar region [M54.16]    Meds Administered:  Meds ordered this encounter  Medications   methylPREDNISolone  acetate (DEPO-MEDROL ) injection 40 mg    Laterality: Right  Location/Site: L5  Needle:5.0 in., 22 ga.  Short bevel or Quincke spinal needle  Needle Placement: Transforaminal  Findings:    -Comments: Excellent flow of contrast along the nerve, nerve root and into the epidural space.  Procedure Details: After squaring off the end-plates to get a true AP view, the C-arm was positioned so that an oblique view of the foramen as noted above was visualized. The target area is just inferior to the nose of the scotty dog or sub pedicular. The soft tissues overlying this structure were infiltrated with 2-3 ml. of 1% Lidocaine  without Epinephrine .  The spinal needle was inserted toward the target using a trajectory view along the fluoroscope beam.  Under AP and lateral visualization, the needle was advanced so it did not puncture dura and was located close the 6 O'Clock position of the pedical in AP tracterory. Biplanar projections were used to confirm position. Aspiration was confirmed to be negative for CSF and/or blood. A 1-2 ml. volume of Isovue-250 was injected and flow of contrast was noted at each level. Radiographs were obtained for documentation purposes.   After attaining the desired flow of contrast documented above, a 0.5 to 1.0 ml test dose of 0.25% Marcaine  was injected into each respective transforaminal space.  The patient was observed for 90 seconds post injection.  After no  sensory deficits were reported, and normal lower extremity motor function was noted,   the above injectate was administered so that equal amounts of the injectate were placed at each foramen (level) into the transforaminal epidural space.   Additional Comments:  The patient tolerated the  procedure well Dressing: 2 x 2 sterile gauze and Band-Aid    Post-procedure details: Patient was observed during the procedure. Post-procedure instructions were reviewed.  Patient left the clinic in stable condition.    Clinical History: CLINICAL DATA: Low back pain with sciatica for several years.  EXAM: MRI LUMBAR SPINE WITHOUT CONTRAST  TECHNIQUE: Multiplanar, multisequence MR imaging of the lumbar spine was performed. No intravenous contrast was administered.  COMPARISON: 08/06/2021  FINDINGS: Segmentation: Stable lumbar numbering with the lowest disc space numbered L5-S1.  Alignment: Physiologic.  Vertebrae: No fracture, evidence of discitis, or bone lesion.  Conus medullaris and cauda equina: Conus extends to the T12 level. Conus and cauda equina appear normal.  Paraspinal and other soft tissues: Negative for perispinal mass or inflammation.  Disc levels:  T12- L1: Unremarkable.  L1-L2: Unremarkable.  L2-L3: Mild degenerative facet spurring.  L3-L4: Disc space narrowing and bulging with endplate and facet spurring. Small bilateral paracentral herniations may be present. Mild to moderate narrowing of the thecal sac which is similar to prior.  L4-L5: Disc narrowing with endplate degeneration and ridging eccentric to the right where there is also greater facet spurring. Mild right foraminal narrowing. Right more than left subarticular recess crowding, herniation appearance at the right subarticular recess has regressed on axial images.  L5-S1:Unremarkable.  IMPRESSION: 1. 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 2. L3-4 mild to moderate spinal stenosis. 3. L4-5 right more than left subarticular recess crowding and mild right foraminal stenosis.   Electronically Signed By: Dorn Roulette M.D. On: 11/12/2022 10:13     Objective:  VS:  HT:    WT:   BMI:     BP:(!) 157/95  HR:71bpm   TEMP: ( )  RESP:  Physical Exam Vitals and nursing note reviewed.  Constitutional:      General: He is not in acute distress.    Appearance: Normal appearance. He is not ill-appearing.  HENT:     Head: Normocephalic and atraumatic.     Right Ear: External ear normal.     Left Ear: External ear normal.     Nose: No congestion.   Eyes:     Extraocular Movements: Extraocular movements intact.    Cardiovascular:     Rate and Rhythm: Normal rate.     Pulses: Normal pulses.  Pulmonary:     Effort: Pulmonary effort is normal. No respiratory distress.  Abdominal:     General: There is no distension.     Palpations: Abdomen is soft.   Musculoskeletal:        General: No tenderness or signs of injury.     Cervical back: Neck supple.     Right lower leg: No edema.     Left lower leg: No edema.     Comments: Patient has good distal strength without clonus.   Skin:    Findings: No erythema or rash.   Neurological:     General: No focal deficit present.     Mental Status: He is alert and oriented to person, place, and time.     Sensory: No sensory deficit.     Motor: No weakness or abnormal muscle tone.     Coordination:  Coordination normal.   Psychiatric:        Mood and Affect: Mood normal.        Behavior: Behavior normal.      Imaging: No results found.

## 2024-03-27 ENCOUNTER — Encounter: Payer: Self-pay | Admitting: Orthopaedic Surgery

## 2024-03-27 ENCOUNTER — Other Ambulatory Visit (INDEPENDENT_AMBULATORY_CARE_PROVIDER_SITE_OTHER): Payer: Self-pay

## 2024-03-27 ENCOUNTER — Ambulatory Visit: Payer: BC Managed Care – PPO | Admitting: Orthopaedic Surgery

## 2024-03-27 DIAGNOSIS — Z96641 Presence of right artificial hip joint: Secondary | ICD-10-CM

## 2024-03-27 NOTE — Progress Notes (Signed)
 The patient is now 10 months status post a right total hip arthroplasty.  He says he is doing great and has no issues with his hip at all.  He is a very active 68 year old gentleman.  He still deals with occasional lumbar spine issues but he said right now they are stable.  He has no issues as a relates to his right operative hip.  His right hip moves smoothly and fluidly with no blocks or rotation.  It moves just like his native left hip which is normal.  Standing AP pelvis and lateral of the right hip shows a well-seated right total hip arthroplasty with no complicating features that is bone ingrown.  The left hip joint space is well-maintained.  At this point follow-up can be as needed since he is doing so well.  He knows that if there are any issues from a musculoskeletal standpoint or any issues with the right hip not to hesitate to let us  know.

## 2024-03-29 ENCOUNTER — Other Ambulatory Visit: Payer: Self-pay | Admitting: Orthopedic Surgery

## 2024-07-06 ENCOUNTER — Ambulatory Visit: Admitting: Orthopedic Surgery

## 2024-07-17 ENCOUNTER — Ambulatory Visit: Admitting: Orthopedic Surgery

## 2024-07-24 NOTE — H&P (View-Only) (Signed)
 Subjective:   CC: History of colon polyps [Z86.0100]  HPI:  referred by Reyes JONETTA Costa, MD for evaluation of above.   History of Present Illness Tubular adenoma in 2020.  Recommended 5 yr f/u.  No new issues since.    Past Medical History:  has a past medical history of Cholelithiasis, Hyperlipidemia, Hyperparathyroidism (HHS-HCC), and Hypertension.  Past Surgical History:  has a past surgical history that includes Hernia repair (1994); Cholecystectomy (2009); Parathyroidectomy (2003); Colonoscopy (01/04/2014); Colonoscopy (08/14/2019); and Prostate surgery (October 03, 2018).  Family History: family history includes Acute myelogenous leukemia in his father; Alcohol abuse in his brother; Colon cancer in his maternal aunt and maternal aunt; Colon polyps in his father; High blood pressure (Hypertension) in his father; Hyperlipidemia (Elevated cholesterol) in his mother; Skin cancer in his mother; Thyroid disease in his mother.  Social History:  reports that he has never smoked. He has never used smokeless tobacco. He reports current alcohol use. He reports that he does not use drugs.  Current Medications: has a current medication list which includes the following prescription(s): atorvastatin , hydrocortisone, and losartan .  Allergies:  Allergies  Allergen Reactions  . Ace Inhibitors Cough    ROS:  A 15 point review of systems was performed and pertinent positives and negatives noted in HPI   Objective:     BP 128/78   Pulse 69   Ht 172.7 cm (5' 8)   Wt 90.7 kg (200 lb)   BMI 30.41 kg/m   Constitutional :  No distress, cooperative, alert  Lymphatics/Throat:  Supple with no lymphadenopathy  Respiratory:  Clear to auscultation bilaterally  Cardiovascular:  Regular rate and rhythm  Gastrointestinal: Soft, non-tender, non-distended, no organomegaly.  Musculoskeletal: Steady gait and movement  Skin: Cool and moist  Psychiatric: Normal affect, non-agitated, not confused          LABS:  N/a   RADS: N/a  Assessment:      History of colon polyps [Z86.0100]  Plan:     1. History of colon polyps [Z86.0100]  R/b/a discussed.  Risks include bleeding, perforation.  Benefits include diagnostic, curative procedure if needed.  Alternatives include continued observation.  Pt verbalized understanding.  2. Patient has elected to proceed with surgical treatment. Procedure will be scheduled.  labs/images/medications/previous chart entries reviewed personally and relevant changes/updates noted above.

## 2024-07-31 ENCOUNTER — Encounter: Payer: Self-pay | Admitting: Radiology

## 2024-08-02 ENCOUNTER — Ambulatory Visit: Payer: BC Managed Care – PPO | Admitting: Dermatology

## 2024-08-02 DIAGNOSIS — D224 Melanocytic nevi of scalp and neck: Secondary | ICD-10-CM

## 2024-08-02 DIAGNOSIS — L578 Other skin changes due to chronic exposure to nonionizing radiation: Secondary | ICD-10-CM

## 2024-08-02 DIAGNOSIS — L814 Other melanin hyperpigmentation: Secondary | ICD-10-CM

## 2024-08-02 DIAGNOSIS — Z808 Family history of malignant neoplasm of other organs or systems: Secondary | ICD-10-CM

## 2024-08-02 DIAGNOSIS — W908XXA Exposure to other nonionizing radiation, initial encounter: Secondary | ICD-10-CM | POA: Diagnosis not present

## 2024-08-02 DIAGNOSIS — D489 Neoplasm of uncertain behavior, unspecified: Secondary | ICD-10-CM

## 2024-08-02 DIAGNOSIS — Z1283 Encounter for screening for malignant neoplasm of skin: Secondary | ICD-10-CM | POA: Diagnosis not present

## 2024-08-02 DIAGNOSIS — L821 Other seborrheic keratosis: Secondary | ICD-10-CM

## 2024-08-02 DIAGNOSIS — D239 Other benign neoplasm of skin, unspecified: Secondary | ICD-10-CM

## 2024-08-02 DIAGNOSIS — D229 Melanocytic nevi, unspecified: Secondary | ICD-10-CM

## 2024-08-02 DIAGNOSIS — L820 Inflamed seborrheic keratosis: Secondary | ICD-10-CM

## 2024-08-02 DIAGNOSIS — D225 Melanocytic nevi of trunk: Secondary | ICD-10-CM | POA: Diagnosis not present

## 2024-08-02 DIAGNOSIS — L918 Other hypertrophic disorders of the skin: Secondary | ICD-10-CM

## 2024-08-02 HISTORY — DX: Other benign neoplasm of skin, unspecified: D23.9

## 2024-08-02 NOTE — Patient Instructions (Addendum)
 Biopsy Wound Care Instructions  Leave the original bandage on for 24 hours if possible.  If the bandage becomes soaked or soiled before that time, it is OK to remove it and examine the wound.  A small amount of post-operative bleeding is normal.  If excessive bleeding occurs, remove the bandage, place gauze over the site and apply continuous pressure (no peeking) over the area for 30 minutes. If this does not work, please call our clinic as soon as possible or page your doctor if it is after hours.   Once a day, cleanse the wound with soap and water. It is fine to shower. If a thick crust develops you may use a Q-tip dipped into dilute hydrogen peroxide (mix 1:1 with water) to dissolve it.  Hydrogen peroxide can slow the healing process, so use it only as needed.    After washing, apply petroleum jelly (Vaseline) or an antibiotic ointment if your doctor prescribed one for you, followed by a bandage.    For best healing, the wound should be covered with a layer of ointment at all times. If you are not able to keep the area covered with a bandage to hold the ointment in place, this may mean re-applying the ointment several times a day.  Continue this wound care until the wound has healed and is no longer open.   Itching and mild discomfort is normal during the healing process. However, if you develop pain or severe itching, please call our office.   If you have any discomfort, you can take Tylenol (acetaminophen) or ibuprofen as directed on the bottle. (Please do not take these if you have an allergy to them or cannot take them for another reason).  Some redness, tenderness and white or yellow material in the wound is normal healing.  If the area becomes very sore and red, or develops a thick yellow-green material (pus), it may be infected; please notify us .    If you have stitches, return to clinic as directed to have the stitches removed. You will continue wound care for 2-3 days after the stitches  are removed.   Wound healing continues for up to one year following surgery. It is not unusual to experience pain in the scar from time to time during the interval.  If the pain becomes severe or the scar thickens, you should notify the office.    A slight amount of redness in a scar is expected for the first six months.  After six months, the redness will fade and the scar will soften and fade.  The color difference becomes less noticeable with time.  If there are any problems, return for a post-op surgery check at your earliest convenience.  To improve the appearance of the scar, you can use silicone scar gel, cream, or sheets (such as Mederma or Serica) every night for up to one year. These are available over the counter (without a prescription).  Please call our office at [phone] for any questions or concerns.  Seborrheic Keratosis  What causes seborrheic keratoses? Seborrheic keratoses are harmless, common skin growths that first appear during adult life.  As time goes by, more growths appear.  Some people may develop a large number of them.  Seborrheic keratoses appear on both covered and uncovered body parts.  They are not caused by sunlight.  The tendency to develop seborrheic keratoses can be inherited.  They vary in color from skin-colored to gray, brown, or even black.  They can be either  smooth or have a rough, warty surface.   Seborrheic keratoses are superficial and look as if they were stuck on the skin.  Under the microscope this type of keratosis looks like layers upon layers of skin.  That is why at times the top layer may seem to fall off, but the rest of the growth remains and re-grows.    Treatment Seborrheic keratoses do not need to be treated, but can easily be removed in the office.  Seborrheic keratoses often cause symptoms when they rub on clothing or jewelry.  Lesions can be in the way of shaving.  If they become inflamed, they can cause itching, soreness, or  burning.  Removal of a seborrheic keratosis can be accomplished by freezing, burning, or surgery. If any spot bleeds, scabs, or grows rapidly, please return to have it checked, as these can be an indication of a skin cancer.   Cryotherapy Aftercare  Wash gently with soap and water everyday.   Apply Vaseline and Band-Aid daily until healed.     Melanoma ABCDEs  Melanoma is the most dangerous type of skin cancer, and is the leading cause of death from skin disease.  You are more likely to develop melanoma if you: Have light-colored skin, light-colored eyes, or red or blond hair Spend a lot of time in the sun Tan regularly, either outdoors or in a tanning bed Have had blistering sunburns, especially during childhood Have a close family member who has had a melanoma Have atypical moles or large birthmarks  Early detection of melanoma is key since treatment is typically straightforward and cure rates are extremely high if we catch it early.   The first sign of melanoma is often a change in a mole or a new dark spot.  The ABCDE system is a way of remembering the signs of melanoma.  A for asymmetry:  The two halves do not match. B for border:  The edges of the growth are irregular. C for color:  A mixture of colors are present instead of an even brown color. D for diameter:  Melanomas are usually (but not always) greater than 6mm - the size of a pencil eraser. E for evolution:  The spot keeps changing in size, shape, and color.  Please check your skin once per month between visits. You can use a small mirror in front and a large mirror behind you to keep an eye on the back side or your body.   If you see any new or changing lesions before your next follow-up, please call to schedule a visit.  Please continue daily skin protection including broad spectrum sunscreen SPF 30+ to sun-exposed areas, reapplying every 2 hours as needed when you're outdoors.   Staying in the shade or wearing  long sleeves, sun glasses (UVA+UVB protection) and wide brim hats (4-inch brim around the entire circumference of the hat) are also recommended for sun protection.    Due to recent changes in healthcare laws, you may see results of your pathology and/or laboratory studies on MyChart before the doctors have had a chance to review them. We understand that in some cases there may be results that are confusing or concerning to you. Please understand that not all results are received at the same time and often the doctors may need to interpret multiple results in order to provide you with the best plan of care or course of treatment. Therefore, we ask that you please give us  2 business days to thoroughly review  all your results before contacting the office for clarification. Should we see a critical lab result, you will be contacted sooner.   If You Need Anything After Your Visit  If you have any questions or concerns for your doctor, please call our main line at [phone] and press option 4 to reach your doctor's medical assistant. If no one answers, please leave a voicemail as directed and we will return your call as soon as possible. Messages left after 4 pm will be answered the following business day.   You may also send us  a message via MyChart. We typically respond to MyChart messages within 1-2 business days.  For prescription refills, please ask your pharmacy to contact our office. Our fax number is [phone].  If you have an urgent issue when the clinic is closed that cannot wait until the next business day, you can page your doctor at the number below.    Please note that while we do our best to be available for urgent issues outside of office hours, we are not available 24/7.   If you have an urgent issue and are unable to reach us , you may choose to seek medical care at your doctor's office, retail clinic, urgent care center, or emergency room.  If you have a medical emergency, please  immediately call 911 or go to the emergency department.  Pager Numbers  - Dr. Hester: [phone]  - Dr. Jackquline: [phone]  - Dr. Claudene: [phone]   - Dr. Raymund: [phone]  In the event of inclement weather, please call our main line at [phone] for an update on the status of any delays or closures.  Dermatology Medication Tips: Please keep the boxes that topical medications come in in order to help keep track of the instructions about where and how to use these. Pharmacies typically print the medication instructions only on the boxes and not directly on the medication tubes.   If your medication is too expensive, please contact our office at [phone] option 4 or send us  a message through MyChart.   We are unable to tell what your co-pay for medications will be in advance as this is different depending on your insurance coverage. However, we may be able to find a substitute medication at lower cost or fill out paperwork to get insurance to cover a needed medication.   If a prior authorization is required to get your medication covered by your insurance company, please allow us  1-2 business days to complete this process.  Drug prices often vary depending on where the prescription is filled and some pharmacies may offer cheaper prices.  The website www.goodrx.com contains coupons for medications through different pharmacies. The prices here do not account for what the cost may be with help from insurance (it may be cheaper with your insurance), but the website can give you the price if you did not use any insurance.  - You can print the associated coupon and take it with your prescription to the pharmacy.  - You may also stop by our office during regular business hours and pick up a GoodRx coupon card.  - If you need your prescription sent electronically to a different pharmacy, notify our office through [facility] or by phone at [phone] option  4.     Si Usted Necesita Algo Despus de Su Visita  Tambin puede enviarnos un mensaje a travs de Clinical cytogeneticist. Por lo general respondemos a los mensajes de MyChart en el transcurso de 1 a 2  das hbiles.  Para renovar recetas, por favor pida a su farmacia que se ponga en contacto con nuestra oficina. Randi lakes de fax es [place] [phone].  Si tiene un asunto urgente cuando la clnica est cerrada y que no puede esperar hasta el siguiente da hbil, puede llamar/localizar a su doctor(a) al nmero que aparece a continuacin.   Por favor, tenga en cuenta que aunque hacemos todo lo posible para estar disponibles para asuntos urgentes fuera del horario de [place], no estamos disponibles las 24 horas del da, los 7 [address]  [address] de la [place].   Si tiene un problema urgente y no puede comunicarse con nosotros, puede optar por buscar atencin mdica  en el consultorio de su doctor(a), en una clnica privada, en un centro de atencin urgente o en una sala de emergencias.  Si tiene Engineer, drilling, por favor llame inmediatamente al 911 o vaya a la sala de emergencias.  Nmeros de bper  - Dr. Hester: [phone]  - Dra. Jackquline: [phone]  - Dr. Claudene: [phone]  - Dra. Kitts: [phone]  En caso de inclemencias del [employer], por favor llame a nuestra lnea principal al [phone] para una actualizacin sobre el estado de cualquier retraso o cierre.  Consejos para la medicacin en dermatologa: Por favor, guarde las cajas en las que vienen los medicamentos de uso tpico para ayudarle a seguir las instrucciones sobre dnde y cmo usarlos. Las farmacias generalmente imprimen las instrucciones del medicamento slo en las cajas y no directamente en los tubos del [place].   Si su medicamento es muy caro, por favor, pngase en contacto con landry rieger llamando al [phone] y presione la opcin 4 o envenos un mensaje a travs de Clinical cytogeneticist.   No podemos decirle cul ser su copago  por los medicamentos por adelantado ya que esto es diferente dependiendo de la cobertura de su seguro. Sin embargo, es posible que podamos encontrar un medicamento sustituto a Audiological scientist un formulario para que el seguro cubra el medicamento que se considera necesario.   Si se requiere una autorizacin previa para que su compaa de seguros malta su medicamento, por favor permtanos de 1 a 2 das hbiles para completar este proceso.  Los precios de los medicamentos varan con frecuencia dependiendo del Environmental consultant de dnde se surte la receta y alguna farmacias pueden ofrecer precios ms baratos.  El sitio web www.goodrx.com tiene cupones para medicamentos de Health and safety inspector. Los precios aqu no tienen en cuenta lo que podra costar con la ayuda del seguro (puede ser ms barato con su seguro), pero el sitio web puede darle el precio si no utiliz Tourist information centre manager.  - Puede imprimir el cupn correspondiente y llevarlo con su receta a la farmacia.  - Tambin puede pasar por nuestra oficina durante el horario de atencin regular y Education officer, museum una tarjeta de cupones de GoodRx.  - Si necesita que su receta se enve electrnicamente a una farmacia diferente, informe a nuestra oficina a travs de MyChart de [place] o por telfono llamando al [phone] y presione la opcin 4.

## 2024-08-02 NOTE — Progress Notes (Signed)
 Follow-Up Visit   Subjective  Shawn Ellison is a 68 y.o. male who presents for the following: Skin Cancer Screening and Full Body Skin Exam Hx of isk  Reports some spots at scalp  The patient presents for Total-Body Skin Exam (TBSE) for skin cancer screening and mole check. The patient has spots, moles and lesions to be evaluated, some may be new or changing and the patient may have concern these could be cancer.    The following portions of the chart were reviewed this encounter and updated as appropriate: medications, allergies, medical history  Review of Systems:  No other skin or systemic complaints except as noted in HPI or Assessment and Plan.  Objective  Well appearing patient in no apparent distress; mood and affect are within normal limits.  A full examination was performed including scalp, head, eyes, ears, nose, lips, neck, chest, axillae, abdomen, back, buttocks, bilateral upper extremities, bilateral lower extremities, hands, feet, fingers, toes, fingernails, and toenails. All findings within normal limits unless otherwise noted below.   Relevant physical exam findings are noted in the Assessment and Plan.  left lateral abdomen above waistline 0.7 cm brown macule  right low back paraspinal 0.6 cm brown macule   left low back paraspinal 0.5 cm brown macule   right mid eyebrow x 1, left epigastric x 1, right mid back x 1 (3) Erythematous stuck-on, waxy papule or plaque  Assessment & Plan    FAMILY HISTORY OF SKIN CANCER What type(s):Unknown Who affected:Mother  SKIN CANCER SCREENING PERFORMED TODAY.  ACTINIC DAMAGE - Chronic condition, secondary to cumulative UV/sun exposure - diffuse scaly erythematous macules with underlying dyspigmentation - Recommend daily broad spectrum sunscreen SPF 30+ to sun-exposed areas, reapply every 2 hours as needed.  - Staying in the shade or wearing long sleeves, sun glasses (UVA+UVB protection) and wide brim hats (4-inch  brim around the entire circumference of the hat) are also recommended for sun protection.  - Call for new or changing lesions.  LENTIGINES, SEBORRHEIC KERATOSES, HEMANGIOMAS - Benign normal skin lesions - Benign-appearing - Call for any changes  MELANOCYTIC NEVI - Behind right ear  -R crown scalp  0.5 cm  flesh colored papule  - Tan-brown and/or pink-flesh-colored symmetric macules and papules - Benign appearing on exam today - Observation - Call clinic for new or changing moles - Recommend daily use of broad spectrum spf 30+ sunscreen to sun-exposed areas.   Acrochordons (Skin Tags) - Fleshy, skin-colored pedunculated papules - Benign appearing.  - Observe. - If desired, they can be removed with an in office procedure that is not covered by insurance. - Please call the clinic if you notice any new or changing lesions.     NEOPLASM OF UNCERTAIN BEHAVIOR (3) left lateral abdomen above waistline Epidermal / dermal shaving  Lesion diameter (cm):  0.7 Informed consent: discussed and consent obtained   Timeout: patient name, date of birth, surgical site, and procedure verified   Procedure prep:  Patient was prepped and draped in usual sterile fashion Prep type:  Isopropyl alcohol Anesthesia: the lesion was anesthetized in a standard fashion   Anesthetic:  1% lidocaine  w/ epinephrine  1-100,000 buffered w/ 8.4% NaHCO3 Instrument used: flexible razor blade   Hemostasis achieved with: pressure, aluminum chloride and electrodesiccation   Outcome: patient tolerated procedure well   Post-procedure details: sterile dressing applied and wound care instructions given   Dressing type: bandage and petrolatum     Specimen 1 - Surgical pathology Differential Diagnosis: nevus r/o dysplasia  Check Margins: yes right low back paraspinal Epidermal / dermal shaving  Lesion diameter (cm):  0.6 Informed consent: discussed and consent obtained   Timeout: patient name, date of birth, surgical  site, and procedure verified   Procedure prep:  Patient was prepped and draped in usual sterile fashion Prep type:  Isopropyl alcohol Anesthesia: the lesion was anesthetized in a standard fashion   Anesthetic:  1% lidocaine  w/ epinephrine  1-100,000 buffered w/ 8.4% NaHCO3 Instrument used: flexible razor blade   Hemostasis achieved with: pressure, aluminum chloride and electrodesiccation   Outcome: patient tolerated procedure well   Post-procedure details: sterile dressing applied and wound care instructions given   Dressing type: bandage and petrolatum     Specimen 2 - Surgical pathology Differential Diagnosis: nevus r/o dysplasia Check Margins: yes left low back paraspinal Epidermal / dermal shaving  Lesion diameter (cm):  0.5 Informed consent: discussed and consent obtained   Timeout: patient name, date of birth, surgical site, and procedure verified   Procedure prep:  Patient was prepped and draped in usual sterile fashion Prep type:  Isopropyl alcohol Anesthesia: the lesion was anesthetized in a standard fashion   Anesthetic:  1% lidocaine  w/ epinephrine  1-100,000 buffered w/ 8.4% NaHCO3 Instrument used: flexible razor blade   Hemostasis achieved with: pressure, aluminum chloride and electrodesiccation   Outcome: patient tolerated procedure well   Post-procedure details: sterile dressing applied and wound care instructions given   Dressing type: bandage and petrolatum     Specimen 3 - Surgical pathology Differential Diagnosis: nevus r/o dysplasia   Check Margins: yes INFLAMED SEBORRHEIC KERATOSIS (3) right mid eyebrow x 1, left epigastric x 1, right mid back x 1 (3) Symptomatic, irritating, patient would like treated. Destruction of lesion - right mid eyebrow x 1, left epigastric x 1, right mid back x 1 (3) Complexity: simple   Destruction method: cryotherapy   Informed consent: discussed and consent obtained   Timeout:  patient name, date of birth, surgical site, and  procedure verified Lesion destroyed using liquid nitrogen: Yes   Region frozen until ice ball extended beyond lesion: Yes   Outcome: patient tolerated procedure well with no complications   Post-procedure details: wound care instructions given    Return in about 1 year (around 08/02/2025) for TBSE.  IEleanor Blush, CMA, am acting as scribe for Alm Rhyme, MD.  Documentation: I have reviewed the above documentation for accuracy and completeness, and I agree with the above.  Alm Rhyme, MD

## 2024-08-04 LAB — SURGICAL PATHOLOGY

## 2024-08-07 ENCOUNTER — Ambulatory Visit: Payer: Self-pay | Admitting: Dermatology

## 2024-08-08 ENCOUNTER — Encounter: Payer: Self-pay | Admitting: Surgery

## 2024-08-08 ENCOUNTER — Encounter: Payer: Self-pay | Admitting: Dermatology

## 2024-08-08 NOTE — Telephone Encounter (Addendum)
 Patient called regarding results. Verbalized understanding and denied further questions.   ----- Message from Alm Rhyme sent at 08/07/2024  5:34 PM EST ----- FINAL DIAGNOSIS        1. Skin, left lateral abdomen above waistline :       DYSPLASTIC COMPOUND NEVUS WITH MODERATE ATYPIA, CLOSE TO MARGIN        2. Skin, right low back paraspinal :       DYSPLASTIC JUNCTIONAL NEVUS WITH MODERATE ATYPIA, CLOSE TO MARGIN        3. Skin, left low back paraspinal :       DYSPLASTIC JUNCTIONAL NEVUS WITH MODERATE ATYPIA, CLOSE TO MARGIN    1,2,3 - All three Moderate Dysplastic Recheck next visit ----- Message ----- From: Interface, Lab In Three Zero One Sent: 08/04/2024   5:55 PM EST To: Alm JAYSON Rhyme, MD

## 2024-08-08 NOTE — Telephone Encounter (Addendum)
 Tried calling patient regarding results. No answer. Left message for patient to return call.  ----- Message from Alm Rhyme sent at 08/07/2024  5:34 PM EST ----- FINAL DIAGNOSIS        1. Skin, left lateral abdomen above waistline :       DYSPLASTIC COMPOUND NEVUS WITH MODERATE ATYPIA, CLOSE TO MARGIN        2. Skin, right low back paraspinal :       DYSPLASTIC JUNCTIONAL NEVUS WITH MODERATE ATYPIA, CLOSE TO MARGIN        3. Skin, left low back paraspinal :       DYSPLASTIC JUNCTIONAL NEVUS WITH MODERATE ATYPIA, CLOSE TO MARGIN    1,2,3 - All three Moderate Dysplastic Recheck next visit ----- Message ----- From: Interface, Lab In Three Zero One Sent: 08/04/2024   5:55 PM EST To: Alm JAYSON Rhyme, MD

## 2024-08-09 ENCOUNTER — Ambulatory Visit: Admitting: Certified Registered"

## 2024-08-09 ENCOUNTER — Ambulatory Visit
Admission: RE | Admit: 2024-08-09 | Discharge: 2024-08-09 | Disposition: A | Discharge status: Home / Self Care | Attending: Surgery | Admitting: Surgery

## 2024-08-09 ENCOUNTER — Encounter: Payer: Self-pay | Admitting: Surgery

## 2024-08-09 ENCOUNTER — Other Ambulatory Visit: Payer: Self-pay

## 2024-08-09 ENCOUNTER — Encounter
Admission: RE | Disposition: A | Discharge status: Home / Self Care | Payer: Self-pay | Source: Home / Self Care | Attending: Surgery

## 2024-08-09 DIAGNOSIS — K640 First degree hemorrhoids: Secondary | ICD-10-CM | POA: Insufficient documentation

## 2024-08-09 DIAGNOSIS — E785 Hyperlipidemia, unspecified: Secondary | ICD-10-CM | POA: Diagnosis not present

## 2024-08-09 DIAGNOSIS — Z8249 Family history of ischemic heart disease and other diseases of the circulatory system: Secondary | ICD-10-CM | POA: Diagnosis not present

## 2024-08-09 DIAGNOSIS — Z83719 Family history of colon polyps, unspecified: Secondary | ICD-10-CM | POA: Insufficient documentation

## 2024-08-09 DIAGNOSIS — Z79899 Other long term (current) drug therapy: Secondary | ICD-10-CM | POA: Insufficient documentation

## 2024-08-09 DIAGNOSIS — Z1211 Encounter for screening for malignant neoplasm of colon: Secondary | ICD-10-CM | POA: Insufficient documentation

## 2024-08-09 DIAGNOSIS — K5730 Diverticulosis of large intestine without perforation or abscess without bleeding: Secondary | ICD-10-CM | POA: Insufficient documentation

## 2024-08-09 DIAGNOSIS — I10 Essential (primary) hypertension: Secondary | ICD-10-CM | POA: Insufficient documentation

## 2024-08-09 DIAGNOSIS — Z860100 Personal history of colon polyps, unspecified: Secondary | ICD-10-CM | POA: Diagnosis present

## 2024-08-09 DIAGNOSIS — M1990 Unspecified osteoarthritis, unspecified site: Secondary | ICD-10-CM | POA: Insufficient documentation

## 2024-08-09 DIAGNOSIS — Z800 Family history of malignant neoplasm of digestive organs: Secondary | ICD-10-CM | POA: Diagnosis not present

## 2024-08-09 HISTORY — DX: Psychophysiologic insomnia: F51.04

## 2024-08-09 HISTORY — PX: COLONOSCOPY: SHX5424

## 2024-08-09 SURGERY — COLONOSCOPY
Anesthesia: General

## 2024-08-09 MED ORDER — PROPOFOL 10 MG/ML IV BOLUS
INTRAVENOUS | Status: AC
Start: 2024-08-09 — End: 2024-08-09
  Filled 2024-08-09: qty 40

## 2024-08-09 MED ORDER — LIDOCAINE HCL (CARDIAC) PF 100 MG/5ML IV SOSY
PREFILLED_SYRINGE | INTRAVENOUS | Status: DC | PRN
  Administered 2024-08-09: 100 mg via INTRAVENOUS

## 2024-08-09 MED ORDER — PROPOFOL 500 MG/50ML IV EMUL
INTRAVENOUS | Status: DC | PRN
  Administered 2024-08-09: 90 mg via INTRAVENOUS
  Administered 2024-08-09: 120 ug/kg/min via INTRAVENOUS

## 2024-08-09 MED ORDER — SODIUM CHLORIDE 0.9 % IV SOLN: INTRAVENOUS | Status: DC

## 2024-08-09 MED ORDER — LIDOCAINE HCL (PF) 2 % IJ SOLN
INTRAMUSCULAR | Status: AC
Start: 2024-08-09 — End: 2024-08-09
  Filled 2024-08-09: qty 5

## 2024-08-09 NOTE — Transfer of Care (Signed)
 Immediate Anesthesia Transfer of Care Note  Patient: Shawn Ellison  Procedure(s) Performed: COLONOSCOPY  Patient Location: Endoscopy Unit  Anesthesia Type:General  Level of Consciousness: awake, alert , and oriented  Airway & Oxygen Therapy: Patient Spontanous Breathing  Post-op Assessment: Report given to RN and Post -op Vital signs reviewed and stable  Post vital signs: Reviewed and stable  Last Vitals:  Vitals Value Taken Time  BP 115/75 08/09/24 08:37  Temp 35.8 0837  Pulse 78 08/09/24 08:37  Resp 18 08/09/24 08:37  SpO2 99 % 08/09/24 08:37  Vitals shown include unfiled device data.  Last Pain:  Vitals:   08/09/24 0718  TempSrc: Temporal  PainSc: 0-No pain         Complications: No notable events documented.

## 2024-08-09 NOTE — Op Note (Signed)
 [facility] Gastroenterology Patient Name: Shawn Ellison Procedure Date: 08/09/2024 8:04 AM MRN: [id number] Account #: [id number] Date of Birth: [birth year] Admit Type: Outpatient Age: 68 Room: [facility] ENDO ROOM 1 Gender: Male Note Status: Finalized Instrument Name: Colon Scope [id number] Procedure:             Colonoscopy Indications:           High risk colon cancer surveillance: Personal history                         of colonic polyps Providers:             Henriette Pierre MD, MD Referring MD:          Reyes BIRCH. Auston, MD (Referring MD) Medicines:             Propofol  per Anesthesia Complications:         No immediate complications. Procedure:             Pre-Anesthesia Assessment:                        - After reviewing the risks and benefits, the patient                         was deemed in satisfactory condition to undergo the                         procedure in an ambulatory setting.                        After obtaining informed consent, the colonoscope was                         passed under direct vision. Throughout the procedure,                         the patient's blood pressure, pulse, and oxygen                         saturations were monitored continuously. The                         Colonoscope was introduced through the anus and                         advanced to the the cecum, identified by the ileocecal                         valve. The colonoscopy was performed without                         difficulty. The patient tolerated the procedure well.                         The quality of the bowel preparation was adequate. Findings:      The perianal and digital rectal examinations were normal.      Non-bleeding internal hemorrhoids were found during retroflexion. The       hemorrhoids were Grade I (internal hemorrhoids that do not prolapse).      A few  small-mouthed diverticula were found in the sigmoid colon. Impression:            -  Non-bleeding internal hemorrhoids.                        - Diverticulosis in the sigmoid colon.                        - No specimens collected. Recommendation:        - Discharge patient to home.                        - Resume previous diet.                        - Written discharge instructions were provided to the                         patient.                        - Repeat colonoscopy in 7-10 years for surveillance. Procedure Code(s):     --- Professional ---                        H9894, Colorectal cancer screening; colonoscopy on                         individual at high risk Diagnosis Code(s):     --- Professional ---                        Z86.010, Personal history of colonic polyps                        K64.0, First degree hemorrhoids                        K57.30, Diverticulosis of large intestine without                         perforation or abscess without bleeding CPT copyright 2022 American [employer]. All rights reserved. The codes documented in this report are preliminary and upon coder review may  be revised to meet current compliance requirements. Dr. Henriette Sevin, MD Henriette Pierre MD, MD 08/09/2024 8:36:08 AM This report has been signed electronically. Number of Addenda: 0 Note Initiated On: 08/09/2024 8:04 AM Scope Withdrawal Time: 0 hours 8 minutes 26 seconds  Total Procedure Duration: 0 hours 12 minutes 0 seconds  Estimated Blood Loss:  Estimated blood loss: none.      [facility]

## 2024-08-09 NOTE — Anesthesia Preprocedure Evaluation (Signed)
 Anesthesia Evaluation  Patient identified by MRN, date of birth, ID band Patient awake    Reviewed: Allergy & Precautions, NPO status , Patient's Chart, lab work & pertinent test results  Airway Mallampati: II  TM Distance: >3 FB Neck ROM: Full    Dental  (+) Teeth Intact   Pulmonary neg pulmonary ROS   Pulmonary exam normal        Cardiovascular Exercise Tolerance: Good hypertension, Pt. on medications negative cardio ROS Normal cardiovascular exam Rhythm:Regular Rate:Normal     Neuro/Psych negative neurological ROS  negative psych ROS   GI/Hepatic negative GI ROS, Neg liver ROS,,,  Endo/Other  negative endocrine ROS    Renal/GU negative Renal ROS  negative genitourinary   Musculoskeletal  (+) Arthritis ,    Abdominal Normal abdominal exam  (+)   Peds negative pediatric ROS (+)  Hematology negative hematology ROS (+)   Anesthesia Other Findings Past Medical History: No date: Arthritis No date: Chronic insomnia 08/02/2024: Dysplastic nevus     Comment:  left lateral abdomen above waistline - moderate will               recheck at follow up 08/02/2024: Dysplastic nevus     Comment:  right low back paraspinal - moderate - recheck at               followup 08/02/2024: Dysplastic nevus     Comment:  left low back paraspinal - moderate - recheck at next               follow up No date: Hyperlipidemia No date: Hypertension No date: Sciatica     Comment:  right L5 distribution- [facility] 2012  Past Surgical History: No date: CHOLECYSTECTOMY 03/25/2007: COLONOSCOPY     Comment:  INTERNAL HEMORRHOIDS PER Dr. Debrah No date: HERNIA REPAIR 2002: HYPERPARATHYROID SURGERY 10/30/2007: LAPAROSCOPIC CHOLECYCSTECTOMY     Comment:  dR. bYRNETT 10/03/2018: LYMPHADENECTOMY; Bilateral     Comment:  Procedure: LYMPHADENECTOMY, PELVIC;  Surgeon: Renda Glance, MD;  Location: [facility] OR[facility];  Service:  Urology;                Laterality: Bilateral; 06/2018: PROSTATE BIOPSY 25 years ago: right inguinal hernia      Comment:  with mesh  No date: RIH 10/03/2018: ROBOT ASSISTED LAPAROSCOPIC RADICAL PROSTATECTOMY; N/A     Comment:  Procedure: XI ROBOTIC ASSISTED LAPAROSCOPIC RADICAL               PROSTATECTOMY LEVEL 2;  Surgeon: Renda Glance, MD;                Location: [facility];  Service: Urology;  Laterality: N/A; 05/14/2023: TOTAL HIP ARTHROPLASTY; Right     Comment:  Procedure: RIGHT TOTAL HIP ARTHROPLASTY ANTERIOR               APPROACH;  Surgeon: Vernetta Lonni GRADE, MD;                Location: [facility];  Service: Orthopedics;  Laterality:               Right;  BMI    Body Mass Index: 28.56 kg/m      Reproductive/Obstetrics negative OB ROS                              Anesthesia Physical Anesthesia Plan  ASA: 2  Anesthesia Plan: General   Post-op Pain Management:    Induction: Intravenous  PONV Risk Score and Plan: Propofol  infusion and TIVA  Airway Management Planned: Natural Airway and Nasal Cannula  Additional Equipment:   Intra-op Plan:   Post-operative Plan:   Informed Consent: I have reviewed the patients History and Physical, chart, labs and discussed the procedure including the risks, benefits and alternatives for the proposed anesthesia with the patient or authorized representative who has indicated his/her understanding and acceptance.     Dental Advisory Given  Plan Discussed with: CRNA  Anesthesia Plan Comments:         Anesthesia Quick Evaluation

## 2024-08-09 NOTE — Interval H&P Note (Signed)
 History and Physical Interval Note:  08/09/2024 8:11 AM  Shawn Ellison  has presented today for surgery, with the diagnosis of HX OF COLON POLYPS.  The various methods of treatment have been discussed with the patient and family. After consideration of risks, benefits and other options for treatment, the patient has consented to  Procedure(s): COLONOSCOPY (N/A) as a surgical intervention.  The patient's history has been reviewed, patient examined, no change in status, stable for surgery.  I have reviewed the patient's chart and labs.  Questions were answered to the patient's satisfaction.     Eljay Lave Tye

## 2024-08-09 NOTE — Anesthesia Postprocedure Evaluation (Signed)
 Anesthesia Post Note  Patient: Shawn Ellison  Procedure(s) Performed: COLONOSCOPY  Patient location during evaluation: PACU Anesthesia Type: General Level of consciousness: awake Pain management: satisfactory to patient Vital Signs Assessment: vitals unstable Respiratory status: spontaneous breathing Cardiovascular status: stable Anesthetic complications: no   No notable events documented.   Last Vitals:  Vitals:   08/09/24 0851 08/09/24 0858  BP:  133/86  Pulse: (!) 58 (!) 52  Resp: 20 17  Temp:    SpO2: 98% 100%    Last Pain:  Vitals:   08/09/24 0851  TempSrc:   PainSc: 0-No pain                 VAN STAVEREN,Amire Leazer

## 2024-08-14 ENCOUNTER — Ambulatory Visit: Admitting: Orthopedic Surgery

## 2024-10-02 ENCOUNTER — Other Ambulatory Visit (INDEPENDENT_AMBULATORY_CARE_PROVIDER_SITE_OTHER)

## 2024-10-02 ENCOUNTER — Ambulatory Visit: Admitting: Orthopedic Surgery

## 2024-10-02 DIAGNOSIS — M5416 Radiculopathy, lumbar region: Secondary | ICD-10-CM

## 2024-10-02 NOTE — Progress Notes (Signed)
 Orthopedic Office Note  Patient is at least doing about 90% better since when he saw me in the fall last year.  He does not have any more radiating leg pain.  His pain had been radiating into the right lower extremity along the lateral aspect of the thigh and leg.  He never had and still does not have any left leg pain.  He does notice some stiffness in his lower back.  He said this resolves as the day goes on.  He has not had any injections recently but did well with those.  Those really help with his symptoms.  On exam, he is in no acute distress.  He is ambulating without assist devices.  He has 5 out of 5 strength in all lower extremity myotomes.  Imaging: XRs of the lumbar spine from 10/02/2024 were independently reviewed and interpreted, showing disc height loss with anterior osteophyte formation at L4/5 and L5/S1.  There is transitional anatomy.  No evidence of instability on flexion/extension views.  No fracture or dislocation seen.  Plan: -Since patient previously did well with injections, if symptoms return would recommend that as a repeat treatment.  Given that the patient is essentially symptom-free at this time, did not recommend any further intervention.  Will continue to monitor.  Plan to see him back in 6 months, x-rays at next visit: AP/lateral/flex/ex lumbar   Ozell DELENA Ada, MD Orthopedic Surgeon

## 2025-04-25 ENCOUNTER — Ambulatory Visit: Admitting: Orthopedic Surgery

## 2025-08-02 ENCOUNTER — Ambulatory Visit: Admitting: Dermatology
